# Patient Record
Sex: Female | Born: 1965 | Race: White | Hispanic: No | Marital: Married | Smoking: Never smoker
Health system: Southern US, Community
[De-identification: ages and names within clinical notes are randomized; demographics above are authoritative.]

## PROBLEM LIST (undated history)

## (undated) DIAGNOSIS — E079 Disorder of thyroid, unspecified: Secondary | ICD-10-CM

## (undated) HISTORY — DX: Disorder of thyroid, unspecified: E07.9

---

## 2007-03-11 ENCOUNTER — Encounter (INDEPENDENT_AMBULATORY_CARE_PROVIDER_SITE_OTHER): Payer: Self-pay | Admitting: Specialist

## 2007-03-11 ENCOUNTER — Inpatient Hospital Stay (HOSPITAL_COMMUNITY): Admission: AD | Admit: 2007-03-11 | Discharge: 2007-03-14 | Payer: Self-pay | Admitting: Obstetrics & Gynecology

## 2007-03-16 ENCOUNTER — Inpatient Hospital Stay (HOSPITAL_COMMUNITY): Admission: AD | Admit: 2007-03-16 | Discharge: 2007-03-16 | Payer: Self-pay | Admitting: Obstetrics & Gynecology

## 2008-07-15 ENCOUNTER — Ambulatory Visit (HOSPITAL_COMMUNITY): Admission: RE | Admit: 2008-07-15 | Discharge: 2008-07-15 | Payer: Self-pay | Admitting: Obstetrics & Gynecology

## 2008-07-15 IMAGING — MG MM DIGITAL SCREENING BILAT
4 series · 4 of 4 positions shown · non-contrast
Comparison: none

DG SCREEN MAMMOGRAM BILATERAL
Bilateral CC and MLO view(s) were taken.
Technologist: APPLETON.(APPLETON)(APPLETON)

DIGITAL SCREENING MAMMOGRAM WITH CAD:
There are scattered fibroglandular densities.  There is no dominant mass, architectural distortion 
or calcification to suggest malignancy.

[R CC]
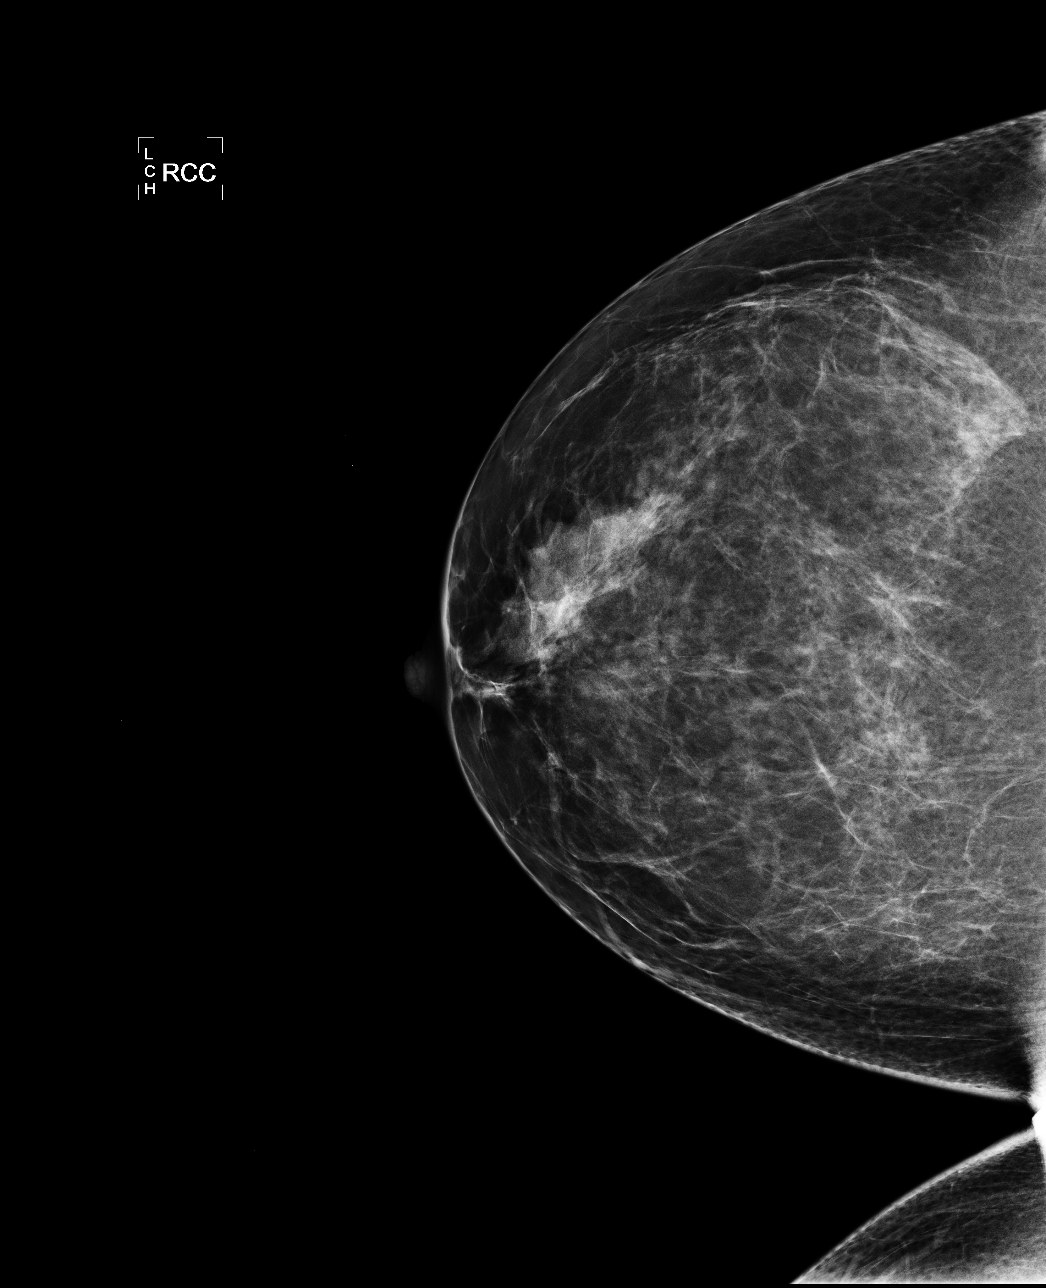

[R MLO]
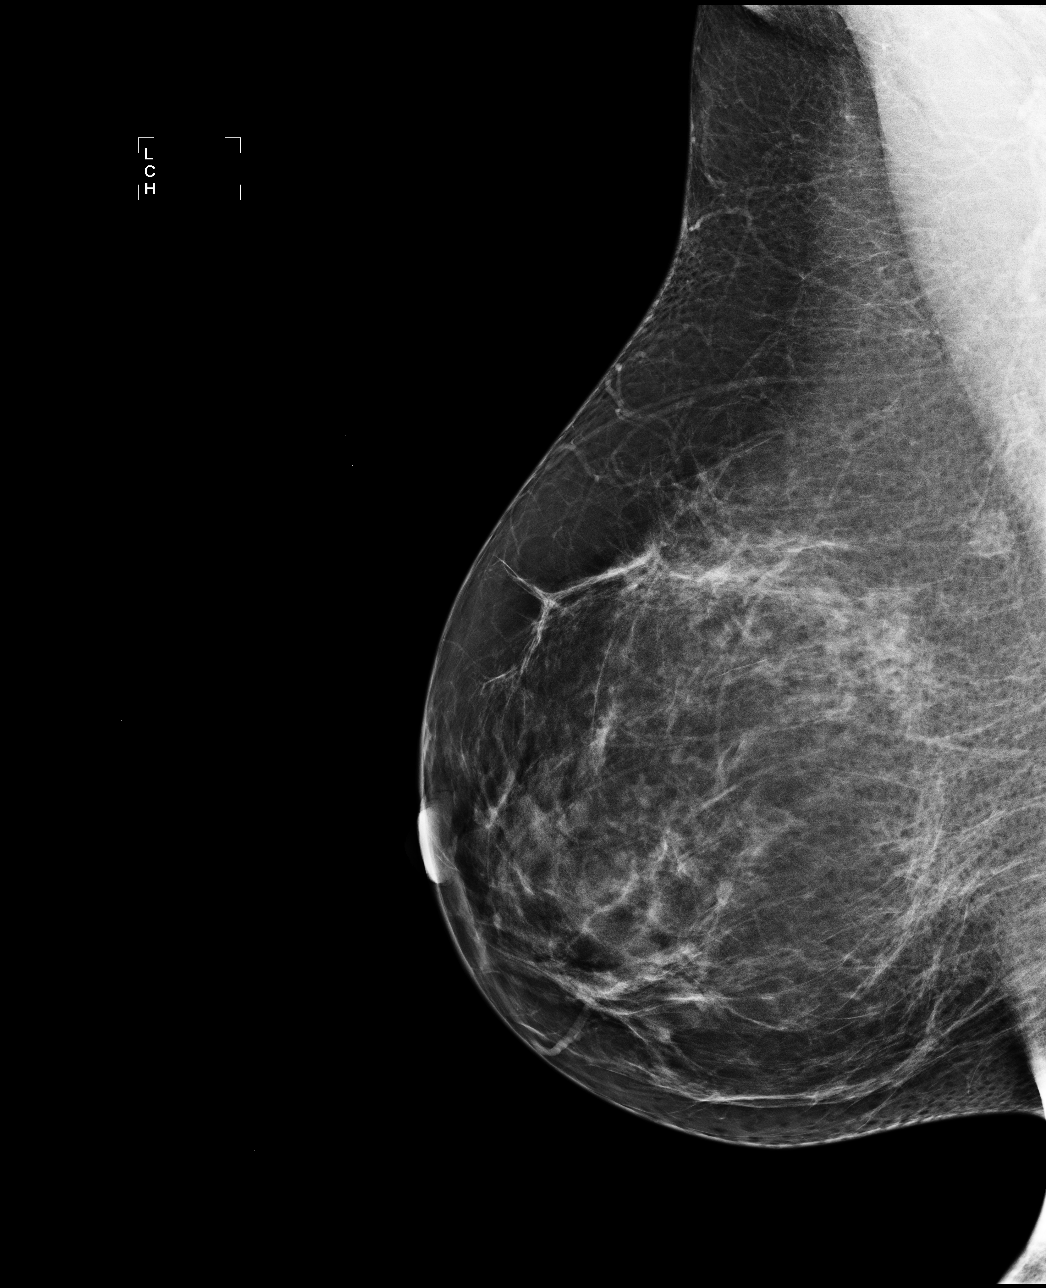

[L CC]
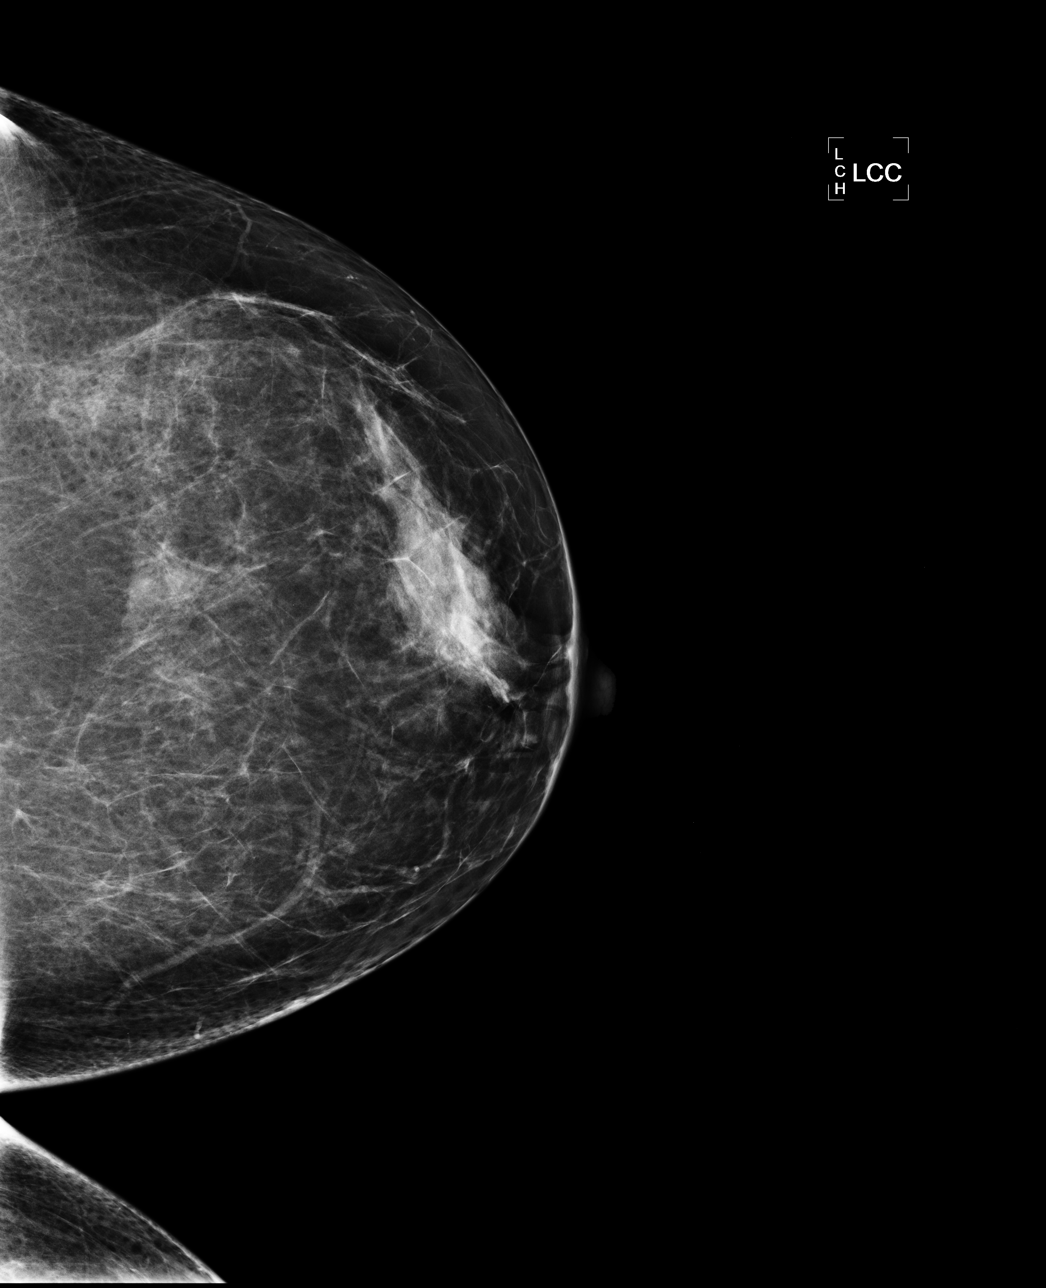

[L MLO]
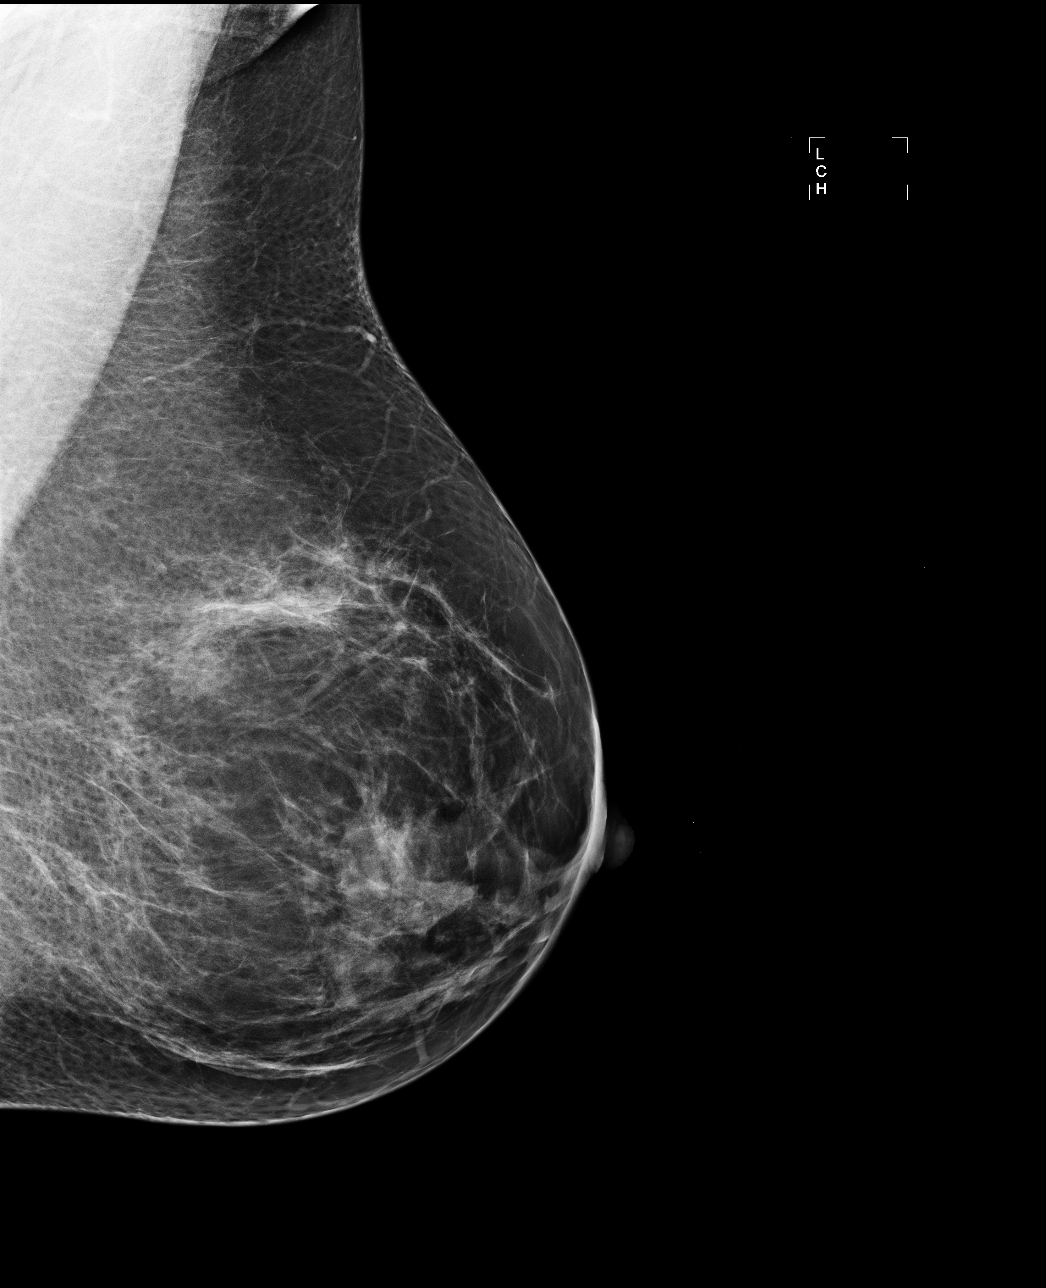

[4 of 4 positions shown; findings below may reference images not displayed]

IMPRESSION: No mammographic evidence of malignancy.  Suggest yearly screening mammography.

ASSESSMENT: Negative - BI-RADS 1

Screening mammogram in 1 year.
ANALYZED BY COMPUTER AIDED DETECTION. , THIS PROCEDURE WAS A DIGITAL MAMMOGRAM.

## 2009-07-31 ENCOUNTER — Ambulatory Visit (HOSPITAL_COMMUNITY): Admission: RE | Admit: 2009-07-31 | Discharge: 2009-07-31 | Payer: Self-pay | Admitting: Obstetrics & Gynecology

## 2009-07-31 IMAGING — MG MM DIGITAL SCREENING BILAT
3 series · 3 of 3 positions shown · non-contrast
Comparison: none

DG SCREEN MAMMOGRAM BILATERAL
Bilateral CC and MLO view(s) were taken.
Technologist: DARIO XAVIER, RT, RM

DIGITAL SCREENING MAMMOGRAM WITH CAD:
There are scattered fibroglandular densities.  No masses or malignant type calcifications are 
identified.  Compared with prior studies.
Images were processed with CAD.

[R CC]
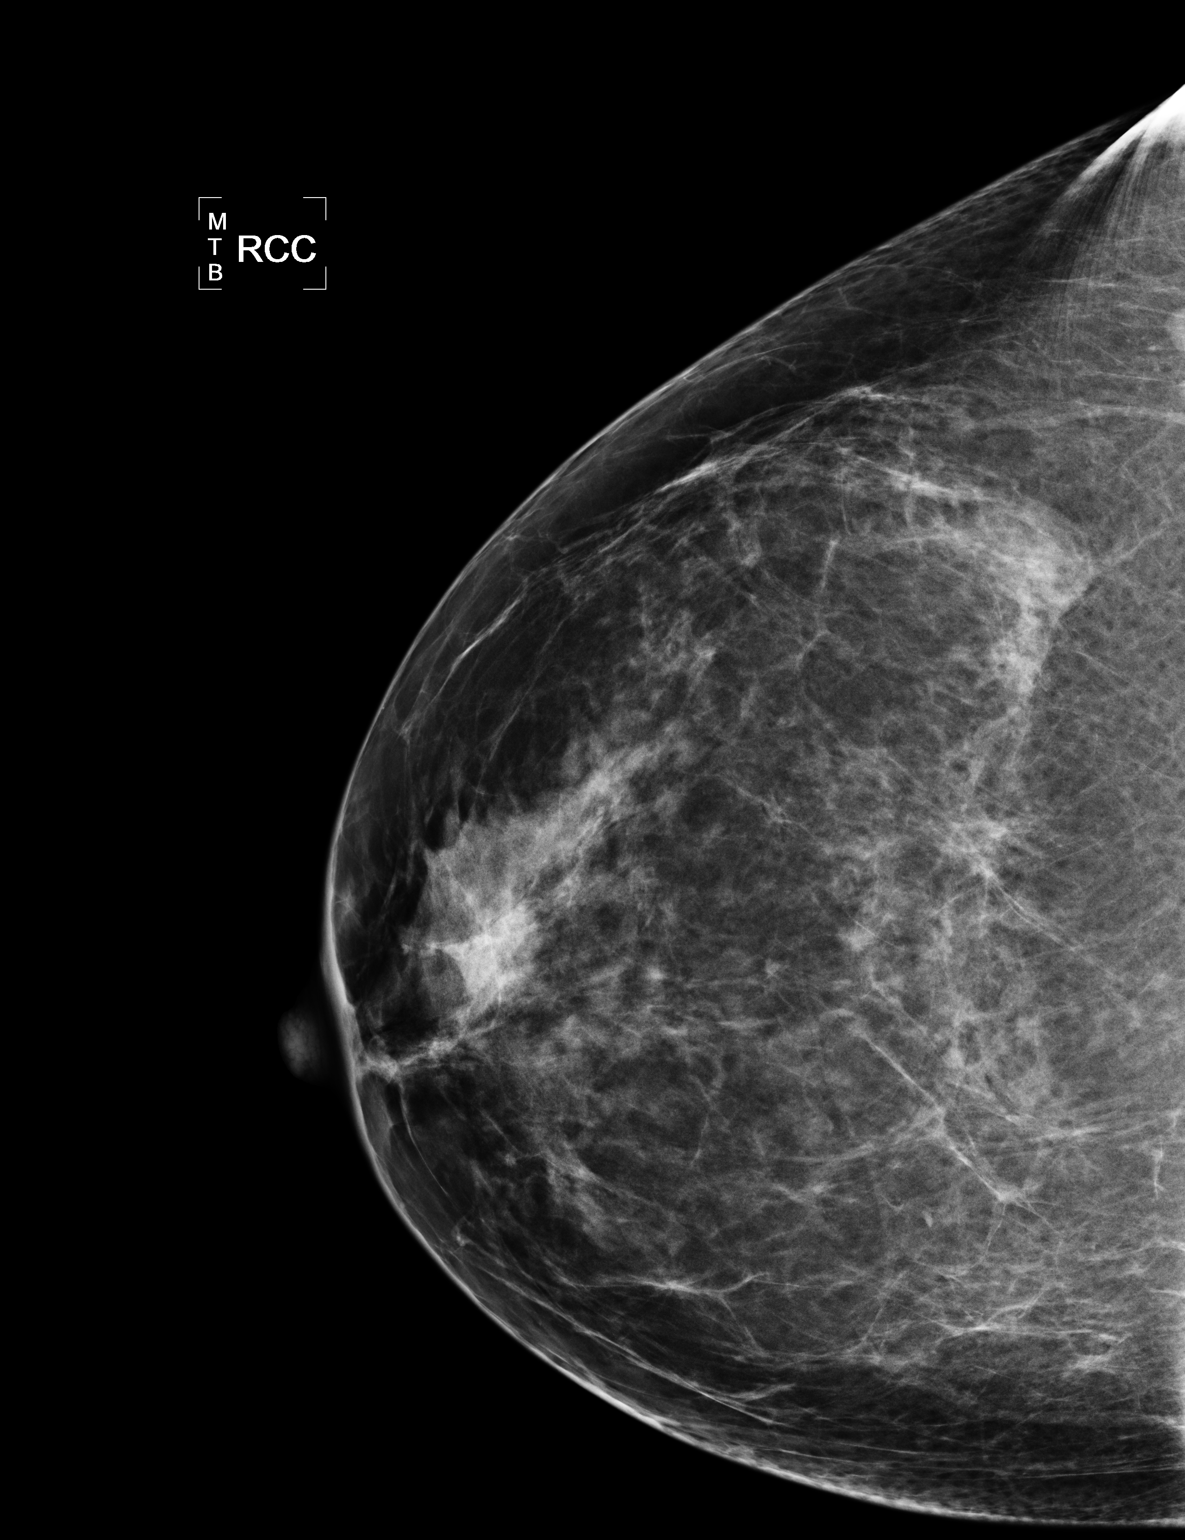

[R MLO]
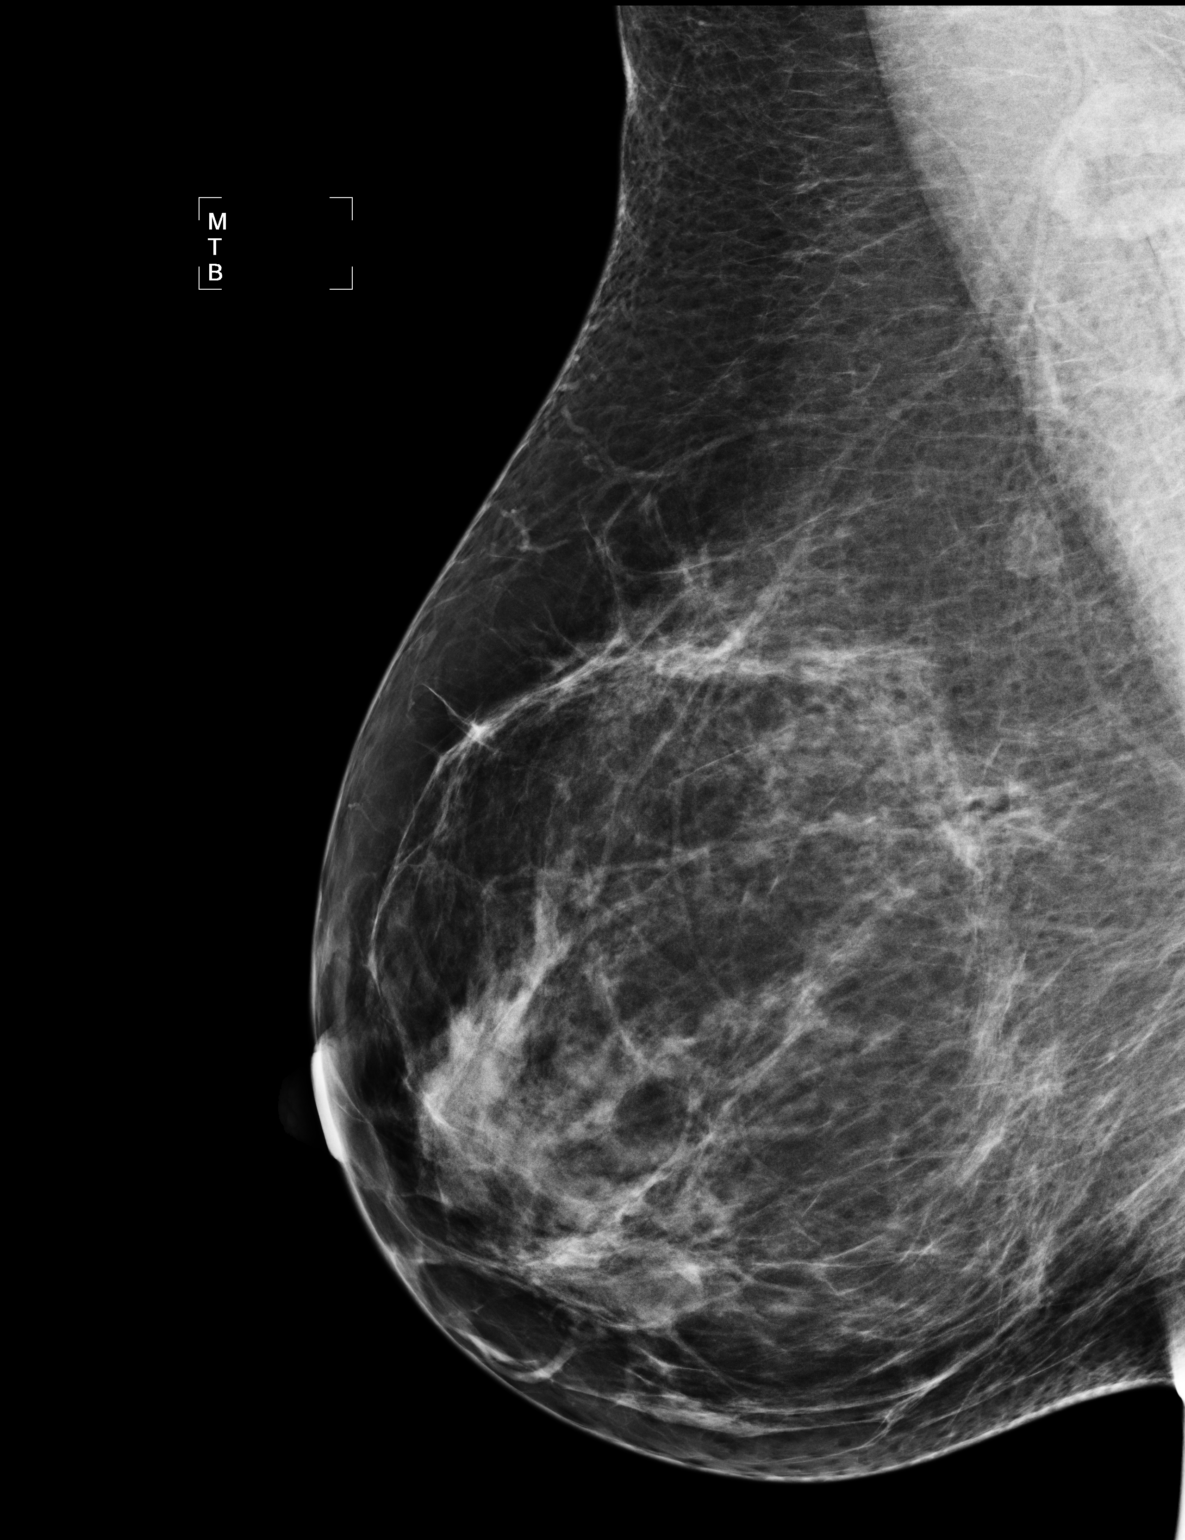

[L CC]
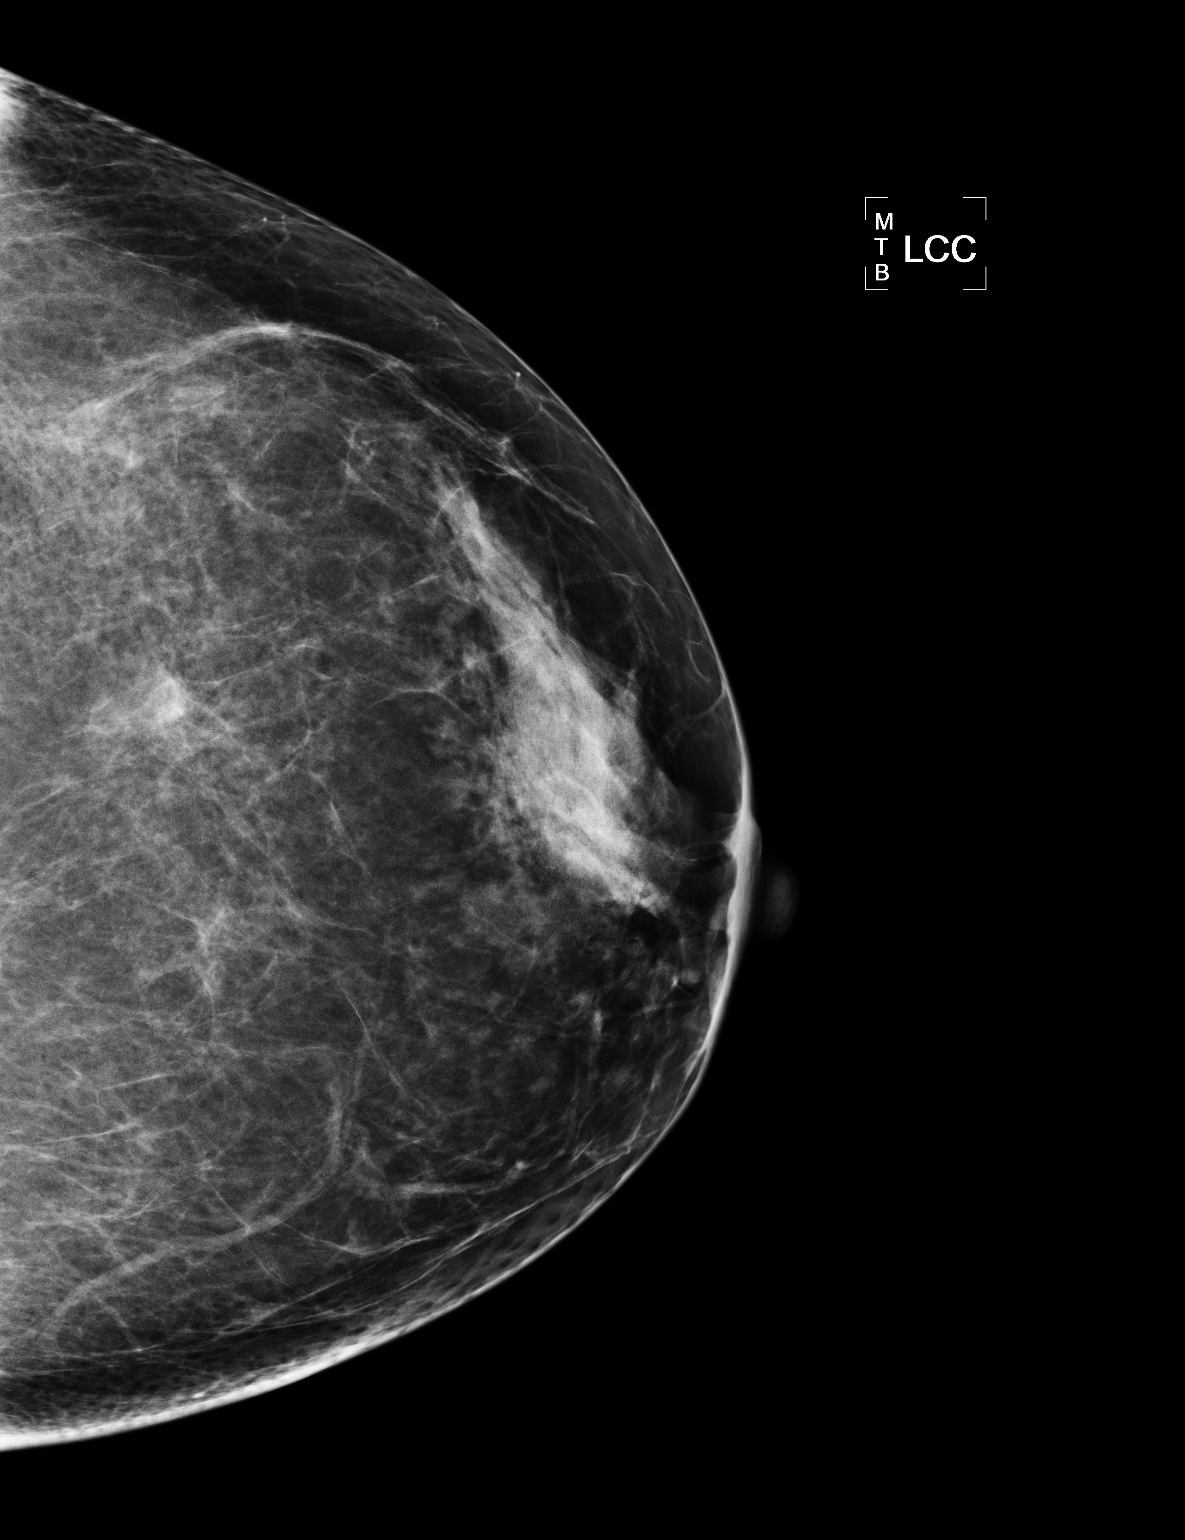

[3 of 3 positions shown; findings below may reference images not displayed]

IMPRESSION: No specific mammographic evidence of malignancy.  Next screening mammogram is recommended in one 
year.

A result letter of this screening mammogram will be mailed directly to the patient.

ASSESSMENT: Negative - BI-RADS 1

Screening mammogram in 1 year.
,

## 2010-11-07 HISTORY — PX: BREAST BIOPSY: SHX20

## 2011-03-22 NOTE — Op Note (Signed)
[name], [name]               ACCOUNT NO.:  [id number]   MEDICAL RECORD NO.:  [id number]          PATIENT TYPE:  INP   LOCATION:  9164                          FACILITY:  [facility]   PHYSICIAN:  [place] B. Earlene Plater, M.D.  DATE OF BIRTH:  [birth year]   DATE OF PROCEDURE:  03/11/2007  DATE OF DISCHARGE:                               OPERATIVE REPORT   PREOPERATIVE DIAGNOSES:  1. Forty-plus-week intrauterine pregnancy.  2. Nonreassuring fetal heart rate tracing.   POSTOPERATIVE DIAGNOSES:  1. Forty-plus-week intrauterine pregnancy.  2. Nonreassuring fetal heart rate tracing.   PROCEDURE:  Primary low transverse C-section.   SURGEON:  Chester Holstein. Earlene Plater, MD.   ANESTHESIA:  Spinal.   SPECIMENS:  Placenta to Pathology.   ESTIMATED BLOOD LOSS:  800.   COMPLICATIONS:  None.   FINDINGS:  A viable female, nuchal cord x1 with a very short umbilical  cord, 7 pounds 11 ounces, LOT position, Apgar's 8 and 9.   INDICATION:  Patient was being induced for post dates pregnancy.  When  she first arrived to Labor and Delivery after returning from the  restroom, she had what appeared to be a bradycardia fetal heart rate for  approximately 3 to 4 minutes, although it was not entirely clear that it  was fetal versus maternal.  Membranes were ruptured, Pitocin initiated.  Subsequently, patient developed an additional 8 to 9 minute deceleration  into the 70s with a prolonged recovery and a few late decelerations  thereafter.  She was still 3 cm at that point, and I discussed with the  patient that it appears that fetus would not tolerate labor given this  one prolonged deceleration and perhaps another one early on in labor,  she is remote from delivery at 3 cm and being a primigravida.  Options  of continued attempt at labor versus primary C-section discussed, the  feedback issues discussed, and they only plan to have 1 or 2 children so  this is less of a factor for them.  Risks of surgery are discussed  including infection, bleeding, damage to surrounding organs.  The  patient and husband decided to go ahead with a C-section at this point.   PROCEDURE:  Patient taken to the operating room, and a spinal anesthesia  obtained.  Patient was prepped and draped in the standard fashion, a  Foley catheter inserted into the bladder.  A Pfannenstiel incision made,  the fascia divided sharply and elevated, the rectus muscles dissected  off sharply, the posterior sheath and peritoneum elevated and entered  sharply, a bladder flap created sharply after the Alexis retractor  inserted.   The uterine incision made in a low transverse fashion with a knife.  Clear fluid at amniotomy.  It was extended laterally with bandage  scissors.   The vertex was elevated through the incision with the assistance of  fundal pressure, the vertex was easily delivered, the nose and mouth  suctioned with a bulb, a fairly tight nuchal cord noted, which was  reducible.  The remainder of the infant delivered without difficulty,  cord was clamped and cord, the infant  handed off to the awaiting  pediatricians.  Patient had just received her penicillin for Group B  Strep prophylaxis prior to going back to the OR; therefore, no  additional prophylaxis was given.  The placenta was removed by uterine  massage, and the umbilical cord was noted to be quite short and in  combination with the nuchal cord presumed to be the cause of her  decelerations.   The uterine incision was free of extension, closed in a running-locked  fashion with #0 chromic and a second imbricating layer placed with the  same suture, hemostasis obtained, tubes and ovaries appeared normal.   The pelvis was irrigated, the uterine incision and bladder flap and  subfascial space were all hemostatic.  The fascia was closed with a  running stitch of #0 Vicryl, the subcutaneous tissue was irrigated and  was hemostatic, reapproximated with a running stitch of #0  Vicryl, the  skin was closed with staples.   The patient tolerated the procedure with no complications.  She was  taken to the recovery room in a stable condition.  All counts were  correct per the operating room staff.      Gerri Spore B. Earlene Plater, M.D.  Electronically Signed     WBD/MEDQ  D:  03/11/2007  T:  03/11/2007  Job:  [id number]

## 2011-03-25 NOTE — Discharge Summary (Signed)
NAMEMARKELA, [name]               ACCOUNT NO.:  [id number]   MEDICAL RECORD NO.:  [id number]          PATIENT TYPE:  INP   LOCATION:  9123                          FACILITY:  [facility]   PHYSICIAN:  Gerri Spore B. Earlene Plater, M.D.  DATE OF BIRTH:  07/20/1966   DATE OF ADMISSION:  03/11/2007  DATE OF DISCHARGE:  03/14/2007                               DISCHARGE SUMMARY   ADMISSION DIAGNOSES:  1. 40-week intrauterine pregnancy.  2. Fetal bradycardia.   DISCHARGE DIAGNOSES:  1. 40-week intrauterine pregnancy.  2. Fetal bradycardia.   PROCEDURE:  1. Admission for induction of labor.  2. Primary low-transverse C-section for nonreassuring fetal heart rate      tracing.   HISTORY OF PRESENT ILLNESS:  A 45 year old white female, gravida 1, 40+  weeks.  Patient of Dr. Seymour Bars admitted for induction of labor.  Prenatal  care was uncomplicated other than group B strep positive.  She was  admitted, membranes ruptured, and Pitocin started.  Was noted to have a  few intermittent decelerations ultimately followed by a prolonged  bradycardia for about 8 minutes.  This ultimately recovered with  positional changes, fluid bolus, and oxygen.  In review of the strip,  there may have been one previous deceleration of a similar manner when  the patient first arrived on the labor and delivery unit.  She was found  to be 3 cm after this prolonged deceleration.  No cord was palpable.  Discussion was obtained with the patient and her husband regarding  options from that point of continued attempts at induction of labor  versus primary C-section.  Concern was that she was remote from delivery  and had already had at least one and possibly 2 prolonged bradycardias.  Therefore, I recommended proceeding with C-section.   The patient was subsequently delivered by primary low-transverse C-  section, a viable female, 7 pounds 11 ounces, Apgars were 8 and 9.  There  was a nuchal cord times one and the umbilical cord was noted  to be quite  short.   Postoperatively, the patient rapidly regained her ability to ambulate,  void, and tolerate a regular diet.  She was discharged home on the third  postoperative day in satisfactory condition.   DISCHARGE INSTRUCTIONS:  Per booklet.   FOLLOW-UP:  [facility] in 6 weeks.   DISCHARGE MEDICATIONS:  Tylox 1-2 tablets every 4-6 hours as needed for  pain.   DISPOSITION AT DISCHARGE:  Satisfactory.      Gerri Spore B. Earlene Plater, M.D.  Electronically Signed     WBD/MEDQ  D:  04/23/2007  T:  04/23/2007  Job:  [id number]

## 2015-03-12 LAB — HM PAP SMEAR: HM PAP: NORMAL

## 2015-05-04 ENCOUNTER — Telehealth: Payer: Self-pay | Admitting: Family Medicine

## 2015-05-04 NOTE — Telephone Encounter (Signed)
-----   Message from Jackson Latino, [place] sent at 05/04/2015  3:41 PM EDT ----- Regarding: FW: New Pt - Referred Contact: [phone] Ok to establish, can you please make this a phone note.   JT  ----- Message -----    From: Sheliah Hatch, MD    Sent: 05/04/2015   3:22 PM      To: Jackson Latino, CMA Subject: RE: New Pt - Referred                          Ok to establish  ----- Message -----    From: Jackson Latino, CMA    Sent: 05/04/2015   3:17 PM      To: Sheliah Hatch, MD Subject: FW: New Pt - Referred                            ----- Message -----    From: Maia Petties    Sent: 05/04/2015   3:09 PM      To: Jackson Latino, CMA Subject: New Pt - Referred                              [name] moving back to this area. She was referred to Dr. Beverely Low by [name], 59 yrs, 04/24/1956 MRN:  [id number]  She has [facility] insurance. Would Dr. Beverely Low accept as a new pt?

## 2015-05-04 NOTE — Telephone Encounter (Signed)
Left msg for pt to call back and schedule new pt appt.

## 2015-05-05 ENCOUNTER — Encounter: Payer: Self-pay | Admitting: Family Medicine

## 2015-05-05 ENCOUNTER — Ambulatory Visit (INDEPENDENT_AMBULATORY_CARE_PROVIDER_SITE_OTHER): Payer: 59 | Admitting: Family Medicine

## 2015-05-05 VITALS — BP 122/80 | HR 103 | Temp 98.9°F | Resp 16 | Wt 185.0 lb

## 2015-05-05 DIAGNOSIS — J209 Acute bronchitis, unspecified: Secondary | ICD-10-CM | POA: Insufficient documentation

## 2015-05-05 MED ORDER — PROMETHAZINE-DM 6.25-15 MG/5ML PO SYRP: 5.0000 mL | ORAL_SOLUTION | Freq: Four times a day (QID) | ORAL | Status: DC | PRN

## 2015-05-05 MED ORDER — AZITHROMYCIN 250 MG PO TABS: ORAL_TABLET | ORAL | Status: DC

## 2015-05-05 NOTE — Patient Instructions (Signed)
Follow up as needed Start the Zpack for the bronchitis and/or atypical pneumonia Use the cough syrup for nights/weekend Mucinex DM for daytime cough Drink plenty of fluids REST! Call with any questions or concerns Welcome!  We're glad to have you!

## 2015-05-05 NOTE — Progress Notes (Signed)
Pre visit review using our clinic review tool, if applicable. No additional management support is needed unless otherwise documented below in the visit note. 

## 2015-05-05 NOTE — Progress Notes (Signed)
   Subjective:    Patient ID: [name], female    DOB: Jul 28, 1966, 49 y.o.   MRN: [id number]  HPI New to establish.  Previous MD- [facility], [place]  Cough- sxs started ~3 weeks ago.  No fevers.  + fatigue.  Cough is intermittently productive but more often than not, pt feels that she needs to cough something up but it is stuck.  No sinus pain/pressure.  No HA.  No ear pain.  No known sick contacts.  No relief w/ OTC products.   Review of Systems For ROS see HPI     Objective:   Physical Exam  Constitutional: She appears well-developed and well-nourished. No distress.  HENT:  Head: Normocephalic and atraumatic.  TMs normal bilaterally Mild nasal congestion Throat w/out erythema, edema, or exudate  Eyes: Conjunctivae and EOM are normal. Pupils are equal, round, and reactive to light.  Neck: Normal range of motion. Neck supple.  Cardiovascular: Normal rate, regular rhythm, normal heart sounds and intact distal pulses.   No murmur heard. Pulmonary/Chest: Effort normal and breath sounds normal. No respiratory distress. She has no wheezes.  + hacking cough  Lymphadenopathy:    She has no cervical adenopathy.  Vitals reviewed.         Assessment & Plan:

## 2015-05-05 NOTE — Assessment & Plan Note (Signed)
New.  Pt's sxs and duration of illness consistent w/ infectious process.  Start Zpack.  Cough syrup as needed.  Reviewed supportive care and red flags that should prompt return.  Pt expressed understanding and is in agreement w/ plan.

## 2015-07-09 LAB — HM MAMMOGRAPHY

## 2015-07-10 ENCOUNTER — Other Ambulatory Visit: Payer: Self-pay | Admitting: Obstetrics and Gynecology

## 2015-07-10 DIAGNOSIS — R928 Other abnormal and inconclusive findings on diagnostic imaging of breast: Secondary | ICD-10-CM

## 2015-07-21 ENCOUNTER — Ambulatory Visit
Admission: RE | Admit: 2015-07-21 | Discharge: 2015-07-21 | Disposition: A | Discharge status: Home / Self Care | Payer: 59 | Source: Ambulatory Visit | Attending: Obstetrics and Gynecology | Admitting: Obstetrics and Gynecology

## 2015-07-21 DIAGNOSIS — R928 Other abnormal and inconclusive findings on diagnostic imaging of breast: Secondary | ICD-10-CM

## 2015-07-21 IMAGING — MG MM DIAG BREAST TOMO UNI LEFT
8 series · 8 of 24 positions shown · non-contrast
Comparison: Previous exam(s).

ADDENDUM:
Diagnostic mammograms images from [DATE] from [HOSPITAL]
in [place], [place] have become available and have been reviewed. These
images demonstrate the asymmetry in the medial left breast, anterior
depth without correlate on the obtained MLO view. Both the rolled
medial and lateral CC views from this study and the tomosynthesis
images from our recent mammogram indicate that this area should be
centrally located in the medial left breast. No ultrasound images
have become available, and presumably have not been done. Therefore,
ultrasound of the medial left breast is recommended for further
evaluation. If no lesion is found on ultrasound, then a 6 month
follow-up bilateral mammogram is recommended to ensure continued
stability.
CLINICAL DATA: 49-year-old female presenting for six-month
follow-up of an asymmetry in the medial left breast, anterior depth
on outside images. The diagnostic images from the outside study were
not available at the time of the patient's appointment.

EXAM:
DIGITAL DIAGNOSTIC LEFT MAMMOGRAM WITH 3D TOMOSYNTHESIS AND CAD

[L MLO (1 of 2)]
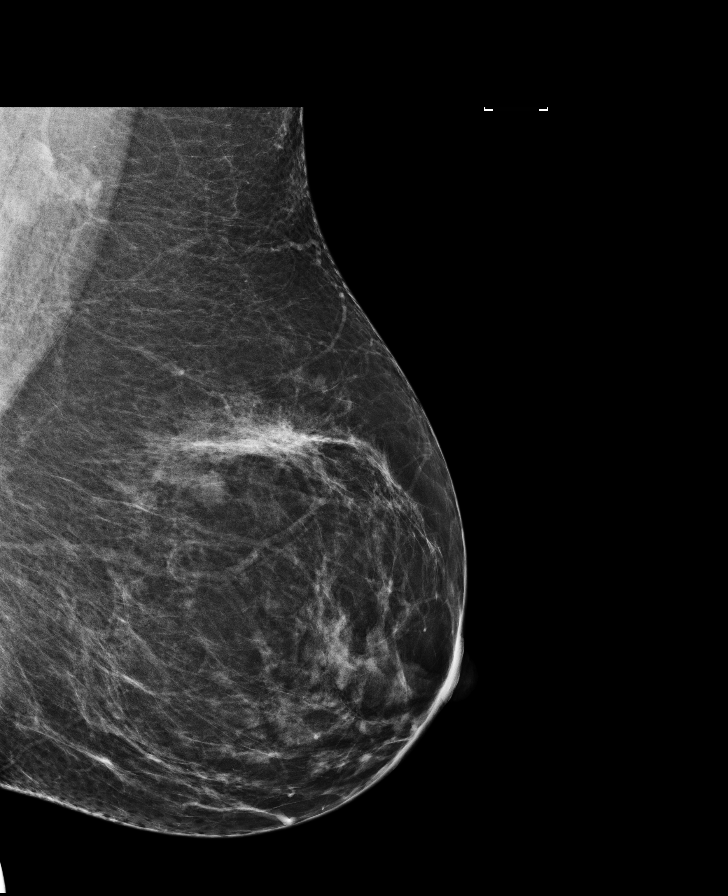

[L MLO (2 of 2)]
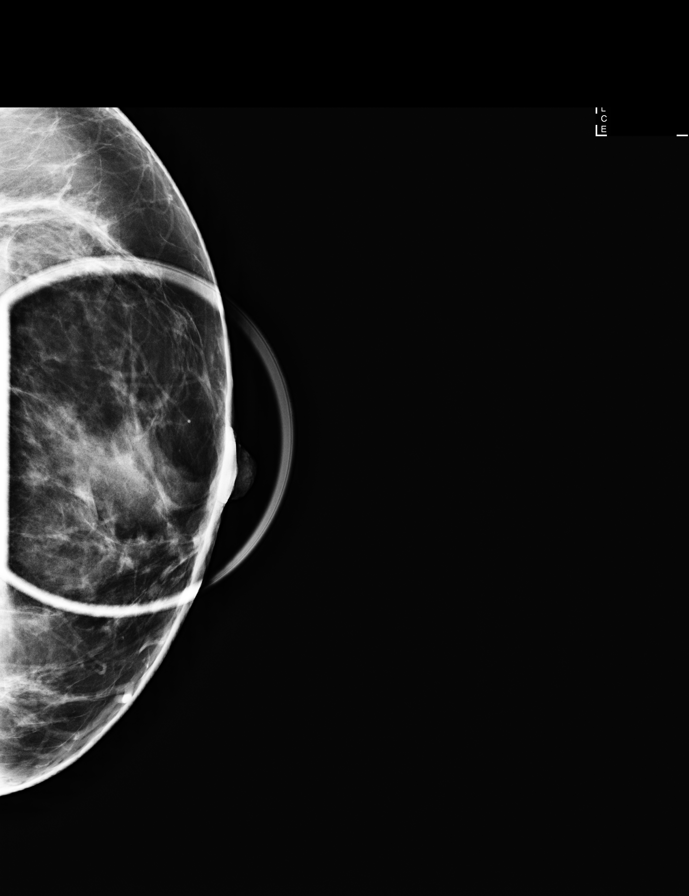

[L CC (1 of 2)]
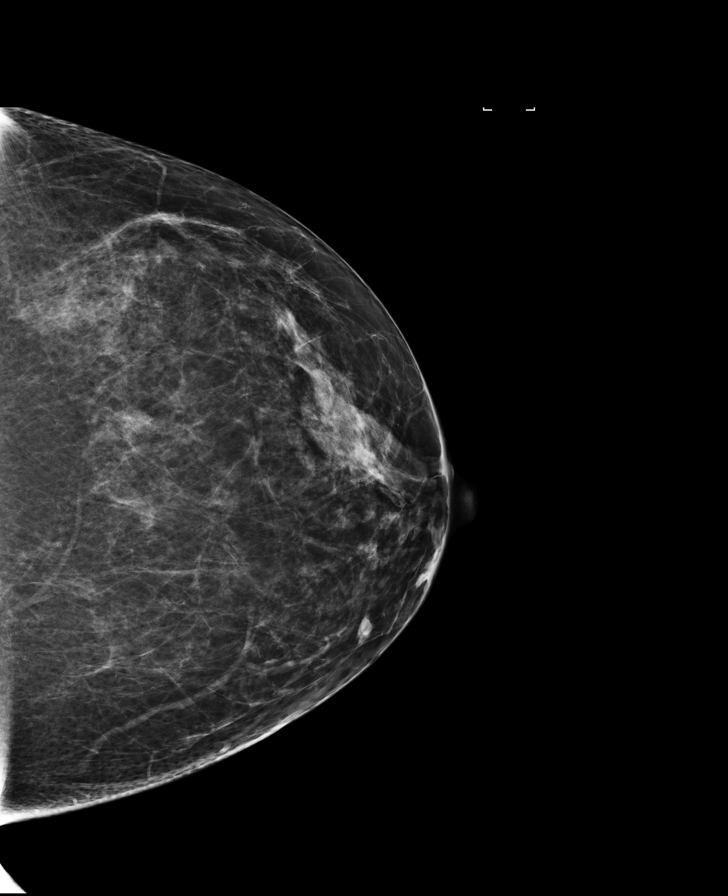

[L CC (2 of 2)]
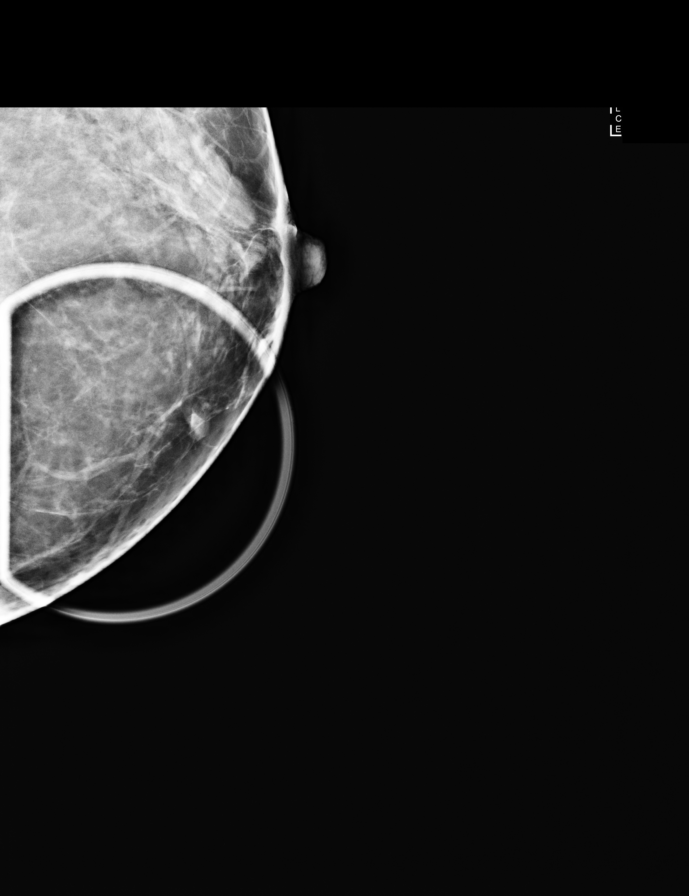

[L CC tomo (1 of 2) · tomo slice 45/89.0]
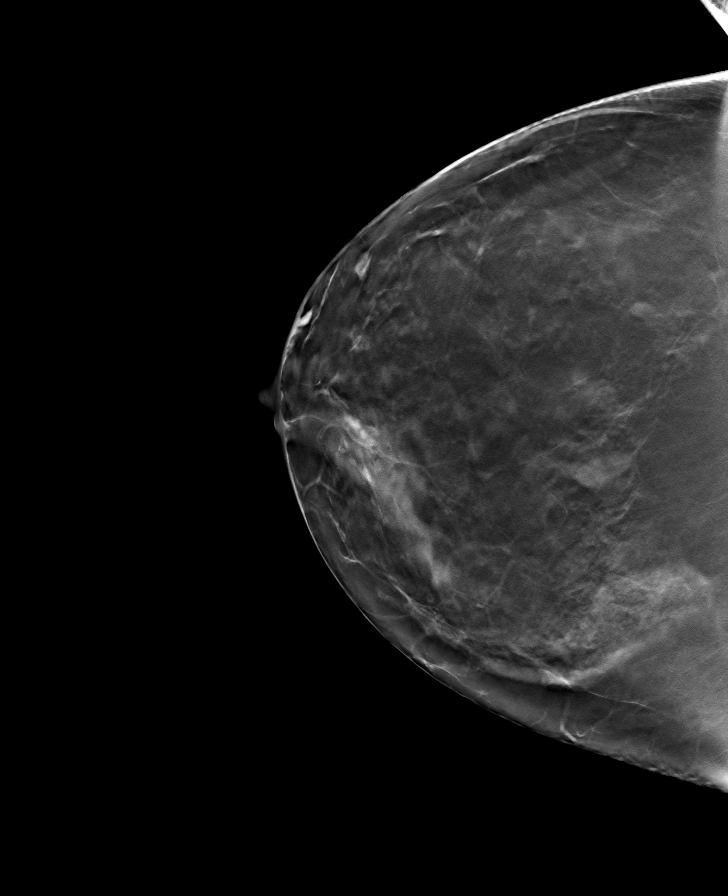

[L CC tomo (2 of 2) · tomo slice 31/62.0]
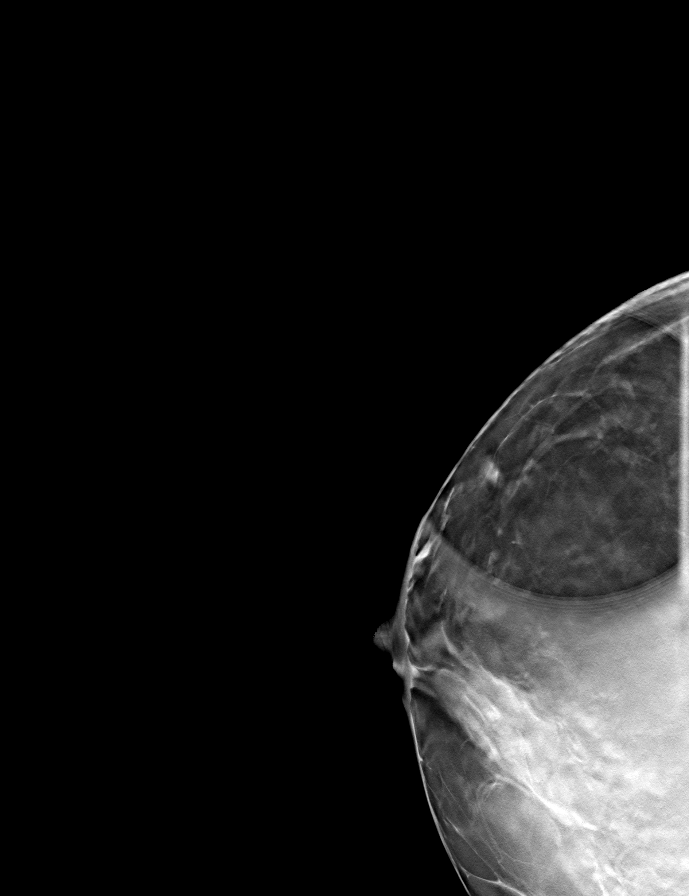

[L MLO tomo (1 of 2) · tomo slice 49/98.0]
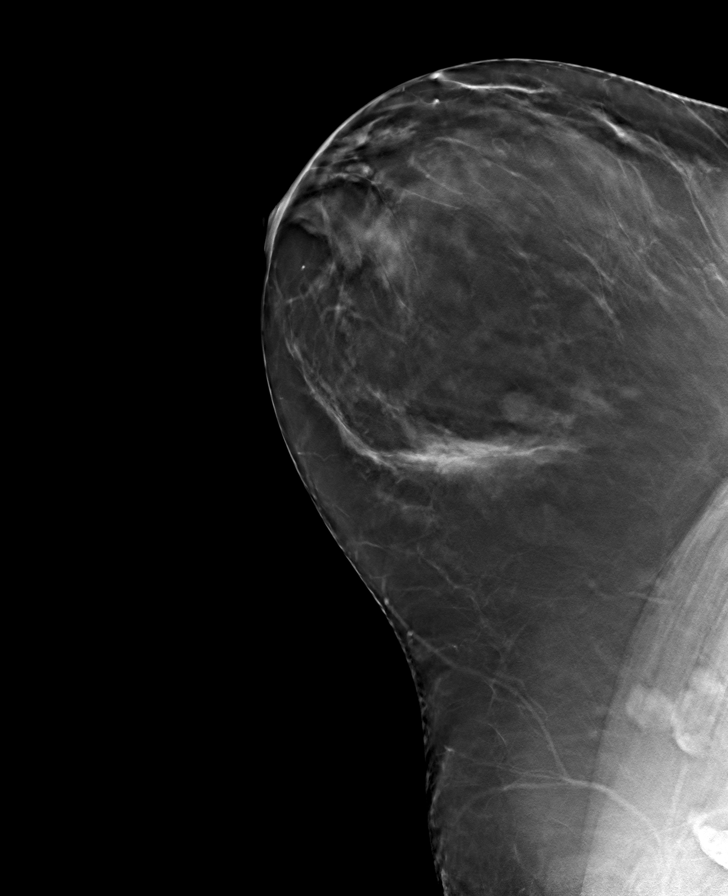

[L MLO tomo (2 of 2) · tomo slice 31/62.0]
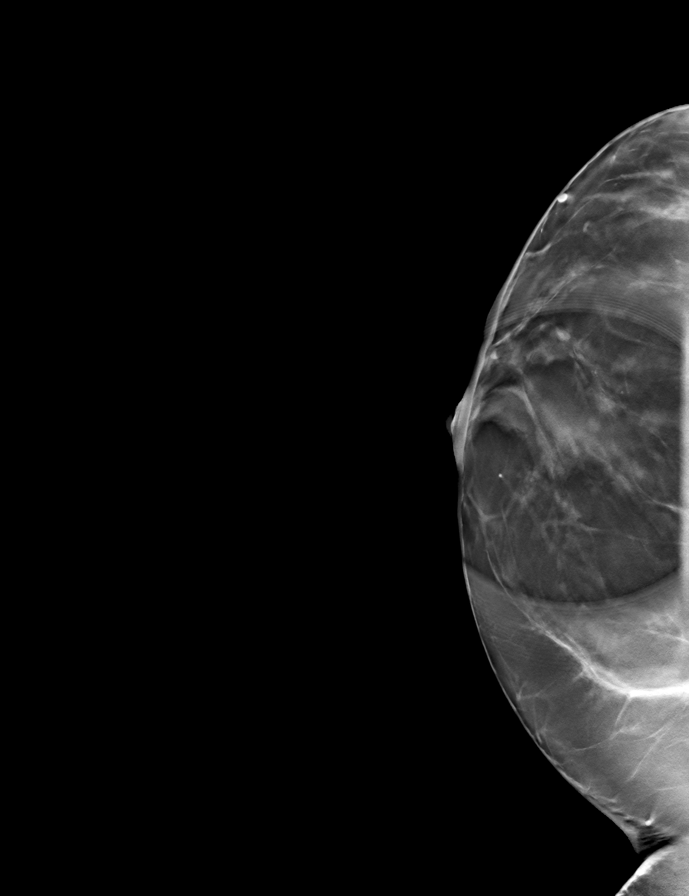

[8 of 24 positions shown; findings below may reference images not displayed]

ACR Breast Density Category b: There are scattered areas of
fibroglandular density.
FINDINGS: In the medial left breast, anterior depth on the CC view, there is a
9 mm asymmetry, without definite correlate on the MLO view. This
appears stable from the prior mammogram. No additional suspicious
masses, calcifications or areas of distortion are seen in the left
breast.

Mammographic images were processed with CAD.
IMPRESSION: 1.  Stable asymmetry in the medial left breast, anterior depth.

2. No other suspicious mammographic abnormalities are identified in
the left breast.

RECOMMENDATION:
The prior diagnostic images of the left breast have been requested.
An addendum will be made to this report when those images are
available. The need for the prior images and possible request for
ultrasound were discussed with the patient at the time of exam. If
the diagnostic images do not become available, the patient should be
recalled for a left breast ultrasound.

I have discussed the findings and recommendations with the patient.
Results were also provided in writing at the conclusion of the
visit. If applicable, a reminder letter will be sent to the patient
regarding the next appointment.

BI-RADS CATEGORY  0: Incomplete. Need additional imaging evaluation
and/or prior mammograms for comparison.

## 2015-07-28 ENCOUNTER — Other Ambulatory Visit: Payer: Self-pay | Admitting: Obstetrics and Gynecology

## 2015-07-28 DIAGNOSIS — R928 Other abnormal and inconclusive findings on diagnostic imaging of breast: Secondary | ICD-10-CM

## 2015-07-31 ENCOUNTER — Ambulatory Visit
Admission: RE | Admit: 2015-07-31 | Discharge: 2015-07-31 | Disposition: A | Discharge status: Home / Self Care | Payer: 59 | Source: Ambulatory Visit | Attending: Obstetrics and Gynecology | Admitting: Obstetrics and Gynecology

## 2015-07-31 DIAGNOSIS — R928 Other abnormal and inconclusive findings on diagnostic imaging of breast: Secondary | ICD-10-CM

## 2015-07-31 IMAGING — US US BREAST LTD UNI LEFT INC AXILLA
1 series · 2 of 2 positions shown · non-contrast
Comparison: Previous exam(s).

CLINICAL DATA: The patient returns for sonographic evaluation left
medial breast after prior initial diagnostic evaluation in [facility]
[DATE], for which prior films have now become available, with
subsequent diagnostic followup [DATE] at our facility.

EXAM:
ULTRASOUND OF THE left BREAST

[Series 1: us breast ltd uni left inc axilla · 0.07mm/px · 2 of 2 slices shown]
[im 1/2]
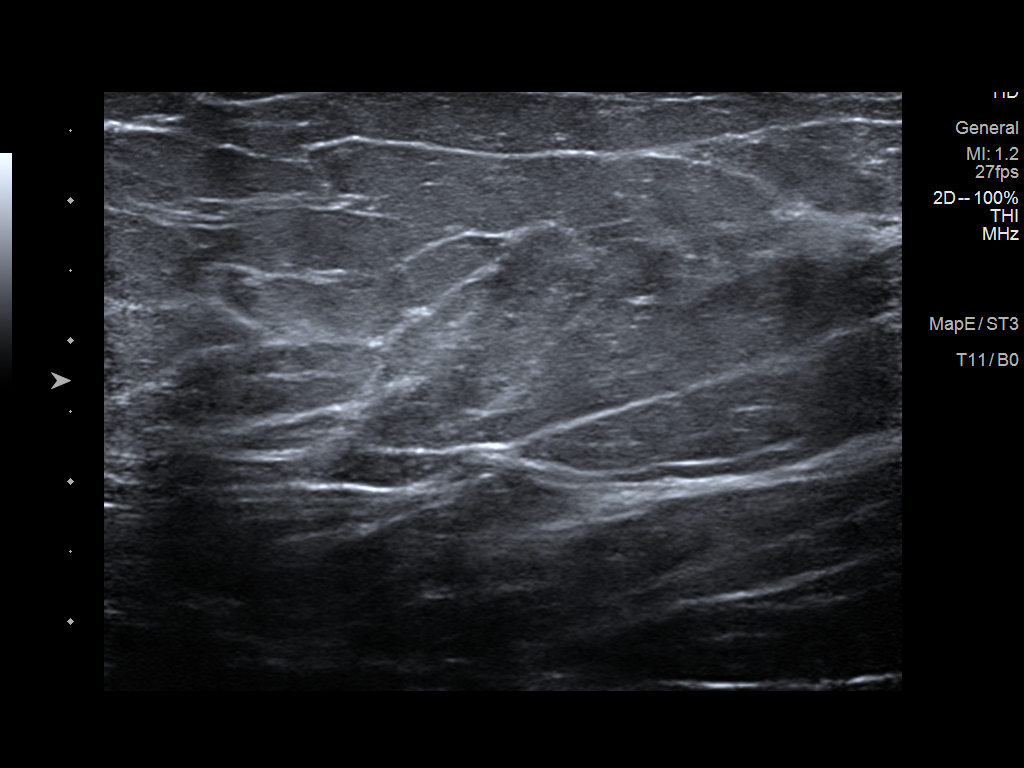
[im 2/2]
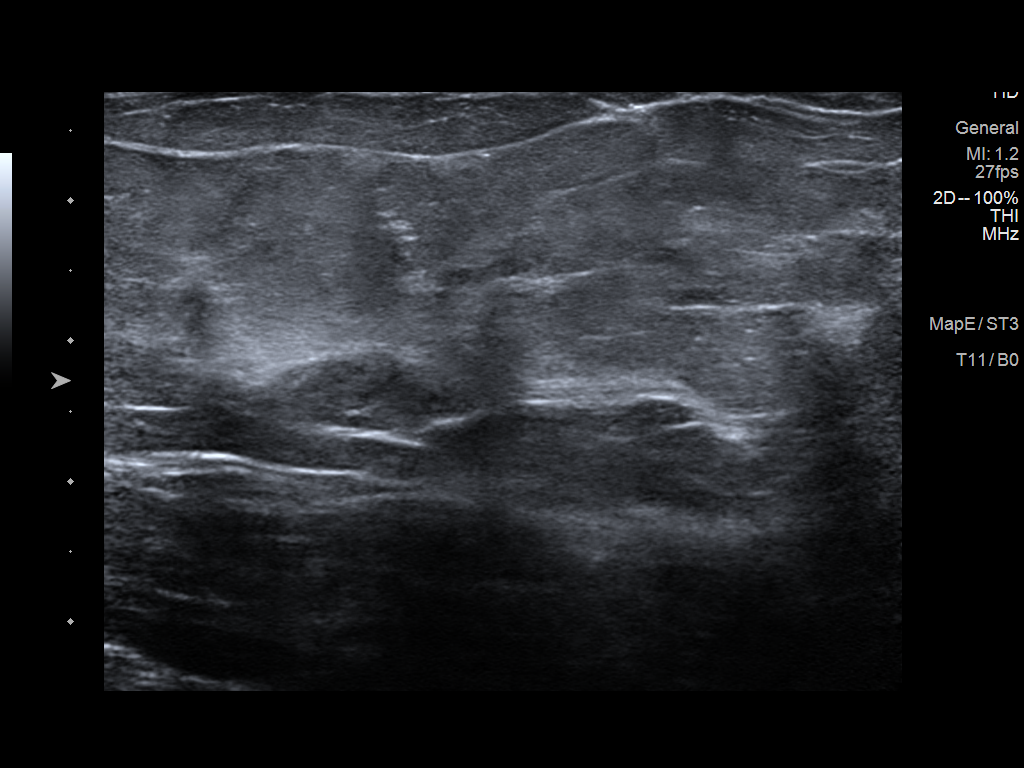

[2 of 2 positions shown; findings below may reference images not displayed]

FINDINGS: On physical exam, I palpate no abnormality in the left medial
breast.

Targeted ultrasound is performed, showing no sonographic correlate
to the asymmetry in the left medial breast.
IMPRESSION: No sonographic correlate or abnormality identified. The mammographic
finding may represent a benign intramammary lymph node or normal
tissue. Malignancy is considered less likely but not excluded. As I
discussed with the patient, although 3D guided biopsy is an option
for further evaluation and to evaluate for malignancy, it could be
difficult to perform biopsy of this finding because it is visualized
on only 1 mammographic projection, and targeting would not
necessarily be confident at the time of 3D guided biopsy. Given the
low risk of malignancy, she agrees with the plan of short-term
interval followup but may contact us sooner if she changes her mind.

RECOMMENDATION:
Bilateral diagnostic mammography and possibly left breast ultrasound
[DATE]

I have discussed the findings and recommendations with the patient.
Results were also provided in writing at the conclusion of the
visit. If applicable, a reminder letter will be sent to the patient
regarding the next appointment.

BI-RADS CATEGORY  3: Probably benign.

## 2015-08-27 ENCOUNTER — Telehealth: Payer: Self-pay | Admitting: Behavioral Health

## 2015-08-27 ENCOUNTER — Encounter: Payer: Self-pay | Admitting: Behavioral Health

## 2015-08-27 NOTE — Telephone Encounter (Signed)
Pre-Visit Call completed with patient and chart updated.   Pre-Visit Info documented in Specialty Comments under SnapShot.    

## 2015-08-28 ENCOUNTER — Encounter: Payer: Self-pay | Admitting: Family Medicine

## 2015-08-28 ENCOUNTER — Ambulatory Visit (INDEPENDENT_AMBULATORY_CARE_PROVIDER_SITE_OTHER): Payer: 59 | Admitting: Family Medicine

## 2015-08-28 VITALS — BP 122/80 | HR 76 | Temp 98.1°F | Resp 16 | Ht 65.75 in | Wt 186.2 lb

## 2015-08-28 DIAGNOSIS — Z0000 Encounter for general adult medical examination without abnormal findings: Secondary | ICD-10-CM | POA: Insufficient documentation

## 2015-08-28 DIAGNOSIS — Z23 Encounter for immunization: Secondary | ICD-10-CM | POA: Diagnosis not present

## 2015-08-28 DIAGNOSIS — Z8249 Family history of ischemic heart disease and other diseases of the circulatory system: Secondary | ICD-10-CM | POA: Diagnosis not present

## 2015-08-28 LAB — CBC WITH DIFFERENTIAL/PLATELET
BASOS PCT: 0 % (ref 0–1)
Basophils Absolute: 0 10*3/uL (ref 0.0–0.1)
EOS ABS: 0.4 10*3/uL (ref 0.0–0.7)
Eosinophils Relative: 4 % (ref 0–5)
HEMATOCRIT: 39.3 % (ref 36.0–46.0)
HEMOGLOBIN: 12.9 g/dL (ref 12.0–15.0)
Lymphocytes Relative: 28 % (ref 12–46)
Lymphs Abs: 2.5 10*3/uL (ref 0.7–4.0)
MCH: 24.8 pg — AB (ref 26.0–34.0)
MCHC: 32.8 g/dL (ref 30.0–36.0)
MCV: 75.6 fL — AB (ref 78.0–100.0)
MONO ABS: 0.6 10*3/uL (ref 0.1–1.0)
MONOS PCT: 7 % (ref 3–12)
MPV: 10.1 fL (ref 8.6–12.4)
Neutro Abs: 5.4 10*3/uL (ref 1.7–7.7)
Neutrophils Relative %: 61 % (ref 43–77)
Platelets: 423 10*3/uL — ABNORMAL HIGH (ref 150–400)
RBC: 5.2 MIL/uL — ABNORMAL HIGH (ref 3.87–5.11)
RDW: 14.9 % (ref 11.5–15.5)
WBC: 8.9 10*3/uL (ref 4.0–10.5)

## 2015-08-28 LAB — TSH: TSH: 0.489 u[IU]/mL (ref 0.350–4.500)

## 2015-08-28 LAB — LIPID PANEL
CHOLESTEROL: 193 mg/dL (ref 125–200)
HDL: 40 mg/dL — ABNORMAL LOW (ref >=46)
LDL Cholesterol: 118 mg/dL (ref <130)
Total CHOL/HDL Ratio: 4.8 Ratio (ref <=5.0)
Triglycerides: 177 mg/dL — ABNORMAL HIGH (ref <150)
VLDL: 35 mg/dL — ABNORMAL HIGH (ref <30)

## 2015-08-28 LAB — BASIC METABOLIC PANEL
BUN: 17 mg/dL (ref 7–25)
CHLORIDE: 103 mmol/L (ref 98–110)
CO2: 23 mmol/L (ref 20–31)
Calcium: 9.3 mg/dL (ref 8.6–10.2)
Creat: 0.6 mg/dL (ref 0.50–1.10)
GLUCOSE: 94 mg/dL (ref 65–99)
POTASSIUM: 4.1 mmol/L (ref 3.5–5.3)
Sodium: 138 mmol/L (ref 135–146)

## 2015-08-28 LAB — HEPATIC FUNCTION PANEL
ALBUMIN: 3.9 g/dL (ref 3.6–5.1)
ALK PHOS: 84 U/L (ref 33–115)
ALT: 11 U/L (ref 6–29)
AST: 11 U/L (ref 10–35)
Bilirubin, Direct: 0.1 mg/dL (ref <=0.2)
Indirect Bilirubin: 0.3 mg/dL (ref 0.2–1.2)
TOTAL PROTEIN: 7.2 g/dL (ref 6.1–8.1)
Total Bilirubin: 0.4 mg/dL (ref 0.2–1.2)

## 2015-08-28 NOTE — Progress Notes (Signed)
   Subjective:    Patient ID: [name], female    DOB: 04-22-1966, 49 y.o.   MRN: [id number]  HPI CPE- UTD on GYN ([facility])  Son has enlarged aorta and pt was told by geneticist that she needs to have screening done.  Husband had ECHO.     Review of Systems Patient reports no vision/ hearing changes, adenopathy,fever, weight change,  persistant/recurrent hoarseness , swallowing issues, chest pain, palpitations, edema, persistant/recurrent cough, hemoptysis, dyspnea (rest/exertional/paroxysmal nocturnal), gastrointestinal bleeding (melena, rectal bleeding), abdominal pain, significant heartburn, bowel changes, GU symptoms (dysuria, hematuria, incontinence), Gyn symptoms (abnormal  bleeding, pain),  syncope, focal weakness, memory loss, numbness & tingling, skin/hair/nail changes, abnormal bruising or bleeding, anxiety, or depression.     Objective:   Physical Exam General Appearance:    Alert, cooperative, no distress, appears stated age, overweight  Head:    Normocephalic, without obvious abnormality, atraumatic  Eyes:    PERRL, conjunctiva/corneas clear, EOM's intact, fundi    benign, both eyes  Ears:    Normal TM's and external ear canals, both ears  Nose:   Nares normal, septum midline, mucosa normal, no drainage    or sinus tenderness  Throat:   Lips, mucosa, and tongue normal; teeth and gums normal  Neck:   Supple, symmetrical, trachea midline, no adenopathy;    Thyroid: no enlargement/tenderness/nodules  Back:     Symmetric, no curvature, ROM normal, no CVA tenderness  Lungs:     Clear to auscultation bilaterally, respirations unlabored  Chest Wall:    No tenderness or deformity   Heart:    Regular rate and rhythm, S1 and S2 normal, no murmur, rub   or gallop  Breast Exam:    Deferred to GYN  Abdomen:     Soft, non-tender, bowel sounds active all four quadrants,    no masses, no organomegaly  Genitalia:    Deferred to GYN  Rectal:    Extremities:   Extremities normal,  atraumatic, no cyanosis or edema  Pulses:   2+ and symmetric all extremities  Skin:   Skin color, texture, turgor normal, no rashes or lesions  Lymph nodes:   Cervical, supraclavicular, and axillary nodes normal  Neurologic:   CNII-XII intact, normal strength, sensation and reflexes    throughout          Assessment & Plan:

## 2015-08-28 NOTE — Assessment & Plan Note (Signed)
Pt's PE WNL w/ exception of being overweight.  Stressed need for healthy diet and regular exercise.  UTD on GYN.  Check labs.  Flu shot given.  Anticipatory guidance provided.

## 2015-08-28 NOTE — Progress Notes (Signed)
Pre visit review using our clinic review tool, if applicable. No additional management support is needed unless otherwise documented below in the visit note. 

## 2015-08-28 NOTE — Patient Instructions (Signed)
Follow up in 1 year or as needed We'll notify you of your lab results and make any changes if needed Continue to work on healthy diet and regular exercise- you can do it!! Call with any questions or concerns If you want to join us at the new [place] office, any scheduled appointments will automatically transfer and we will see you at [address], [place], [place] [id number]  Happy Fall!!!

## 2015-08-29 LAB — VITAMIN D 25 HYDROXY (VIT D DEFICIENCY, FRACTURES): Vit D, 25-Hydroxy: 20 ng/mL — ABNORMAL LOW (ref 30–100)

## 2015-09-01 ENCOUNTER — Other Ambulatory Visit: Payer: Self-pay | Admitting: General Practice

## 2015-09-01 MED ORDER — VITAMIN D (ERGOCALCIFEROL) 1.25 MG (50000 UNIT) PO CAPS: 50000.0000 [IU] | ORAL_CAPSULE | ORAL | Status: DC

## 2015-09-02 ENCOUNTER — Encounter: Payer: Self-pay | Admitting: Family Medicine

## 2015-09-02 MED ORDER — PHENTERMINE HCL 37.5 MG PO CAPS: 37.5000 mg | ORAL_CAPSULE | ORAL | Status: AC

## 2015-09-02 NOTE — Telephone Encounter (Signed)
Medication filled to pharmacy as requested.   

## 2015-09-10 ENCOUNTER — Other Ambulatory Visit: Payer: Self-pay

## 2015-09-10 ENCOUNTER — Ambulatory Visit (HOSPITAL_COMMUNITY): Payer: 59 | Attending: Internal Medicine

## 2015-09-10 DIAGNOSIS — I517 Cardiomegaly: Secondary | ICD-10-CM | POA: Insufficient documentation

## 2015-09-10 DIAGNOSIS — Z8249 Family history of ischemic heart disease and other diseases of the circulatory system: Secondary | ICD-10-CM | POA: Insufficient documentation

## 2015-09-11 ENCOUNTER — Encounter: Payer: Self-pay | Admitting: Family Medicine

## 2015-11-24 ENCOUNTER — Telehealth: Payer: Self-pay | Admitting: Family Medicine

## 2015-11-24 NOTE — Telephone Encounter (Signed)
Vitamin D 50,000 IU was prescribed on 08/28/2015 for 12 weeks following lab results.  PCP may not want the patient to continue as may have completed amount needed.  Called the patient to return call to our office regarding request. Will also forward this request to PCP for review.

## 2015-11-24 NOTE — Telephone Encounter (Signed)
Caller name: Olegario Messier  Relationship to patient: [facility]   Can be reached: [phone]  Pharmacy: [id number] - [place], [place] - [address] AT [facility] [address]  Reason for call: She is requesting a refill on pt's Vitamin D.

## 2015-11-25 NOTE — Telephone Encounter (Addendum)
Left message on patients answering machine. Advised she needs to come in for labs (Vitamin 25) before Vitamin D can be refilled.

## 2015-11-25 NOTE — Telephone Encounter (Signed)
No refill on Vit D w/o repeat Vit D 25, hydroxy testing to determine if this is needed.  Should continue daily supplement of at least 2,000 units

## 2015-11-27 ENCOUNTER — Telehealth: Payer: Self-pay | Admitting: Family Medicine

## 2015-11-27 NOTE — Telephone Encounter (Signed)
error:[id number] ° °

## 2015-11-30 ENCOUNTER — Other Ambulatory Visit: Payer: Self-pay

## 2015-11-30 MED ORDER — VITAMIN D (ERGOCALCIFEROL) 1.25 MG (50000 UNIT) PO CAPS: 50000.0000 [IU] | ORAL_CAPSULE | ORAL | Status: AC

## 2015-12-04 ENCOUNTER — Other Ambulatory Visit: Payer: Self-pay | Admitting: Obstetrics and Gynecology

## 2015-12-04 DIAGNOSIS — R928 Other abnormal and inconclusive findings on diagnostic imaging of breast: Secondary | ICD-10-CM

## 2016-01-04 ENCOUNTER — Ambulatory Visit
Admission: RE | Admit: 2016-01-04 | Discharge: 2016-01-04 | Disposition: A | Discharge status: Home / Self Care | Payer: 59 | Source: Ambulatory Visit | Attending: Obstetrics and Gynecology | Admitting: Obstetrics and Gynecology

## 2016-01-04 DIAGNOSIS — R928 Other abnormal and inconclusive findings on diagnostic imaging of breast: Secondary | ICD-10-CM

## 2016-01-04 IMAGING — MG MM DIAG BREAST TOMO BILATERAL
8 series · 8 of 24 positions shown · non-contrast
Comparison: [DATE], [DATE], [DATE], [DATE]

CLINICAL DATA: Re-evaluate left breast asymmetry. The patient is
asymptomatic.

EXAM:
DIGITAL DIAGNOSTIC BILATERAL MAMMOGRAM WITH 3D TOMOSYNTHESIS WITH
CAD
ULTRASOUND LEFT BREAST

[R MLO]
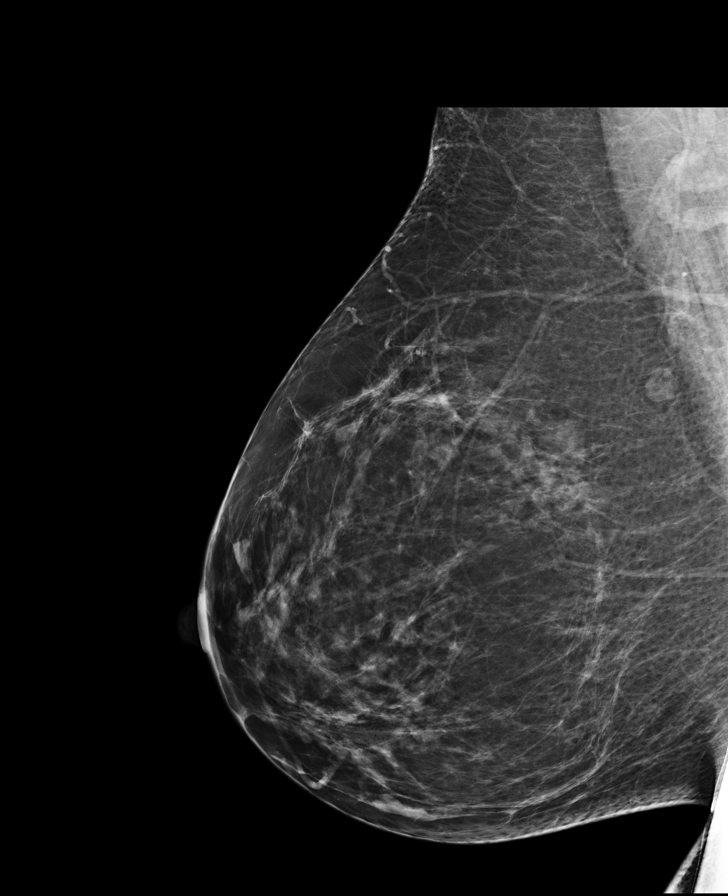

[R CC]
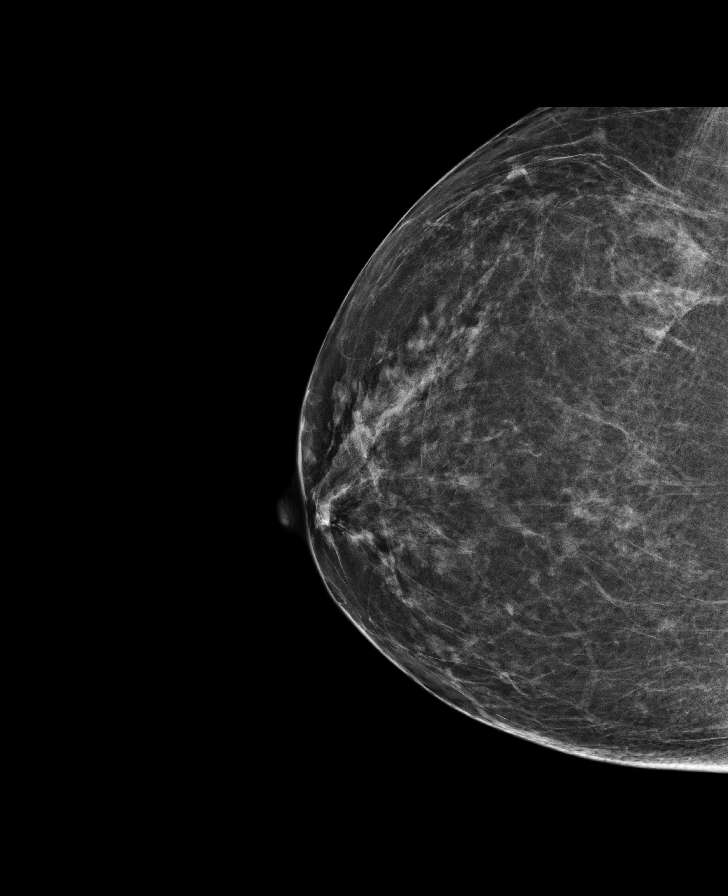

[L MLO]
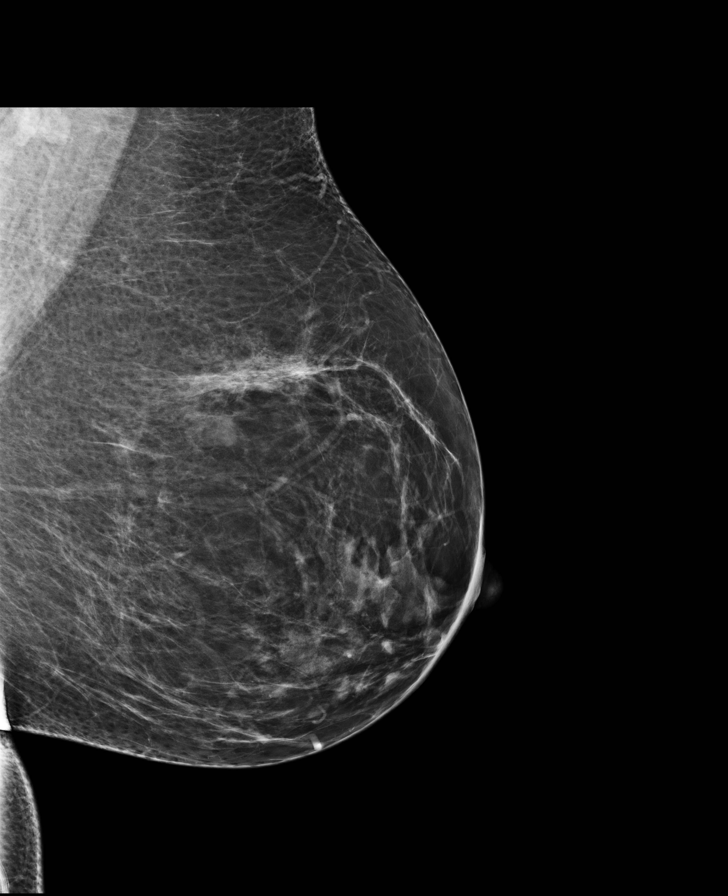

[L CC]
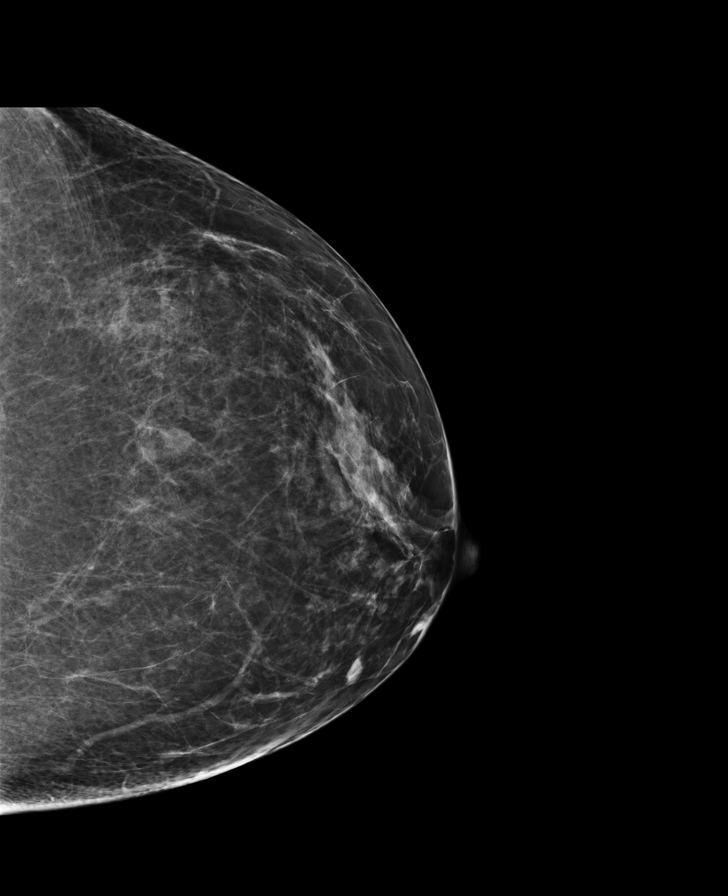

[R MLO tomo · tomo slice 48/95.0]
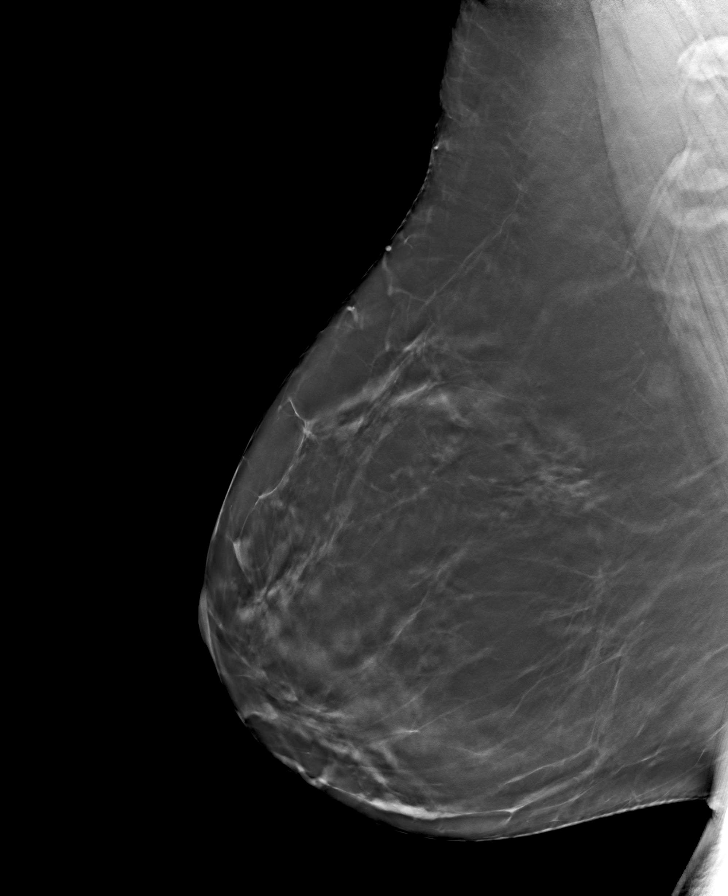

[L MLO tomo · tomo slice 46/91.0]
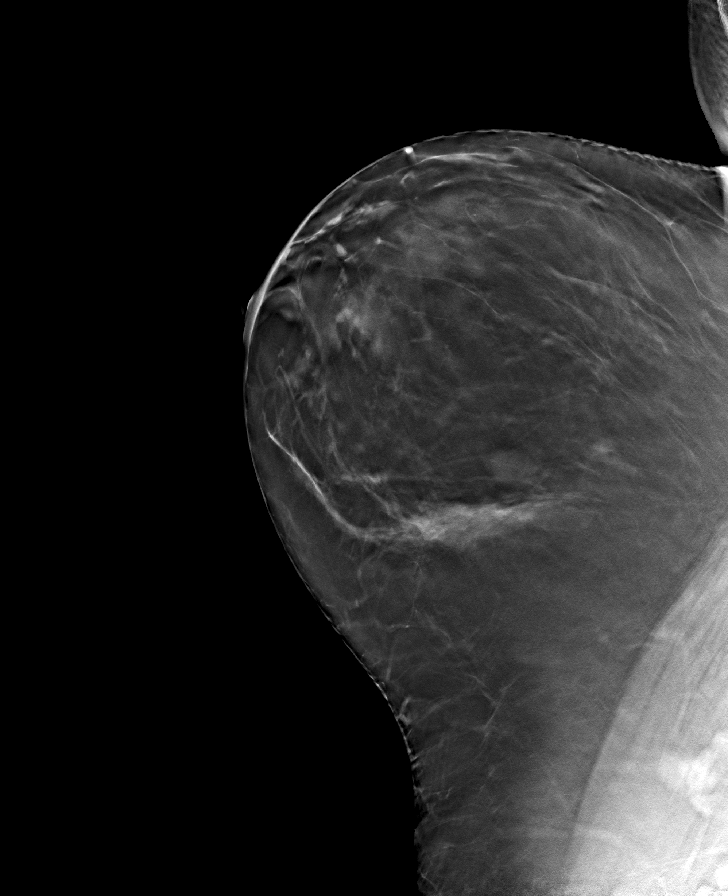

[L CC tomo · tomo slice 48/95.0]
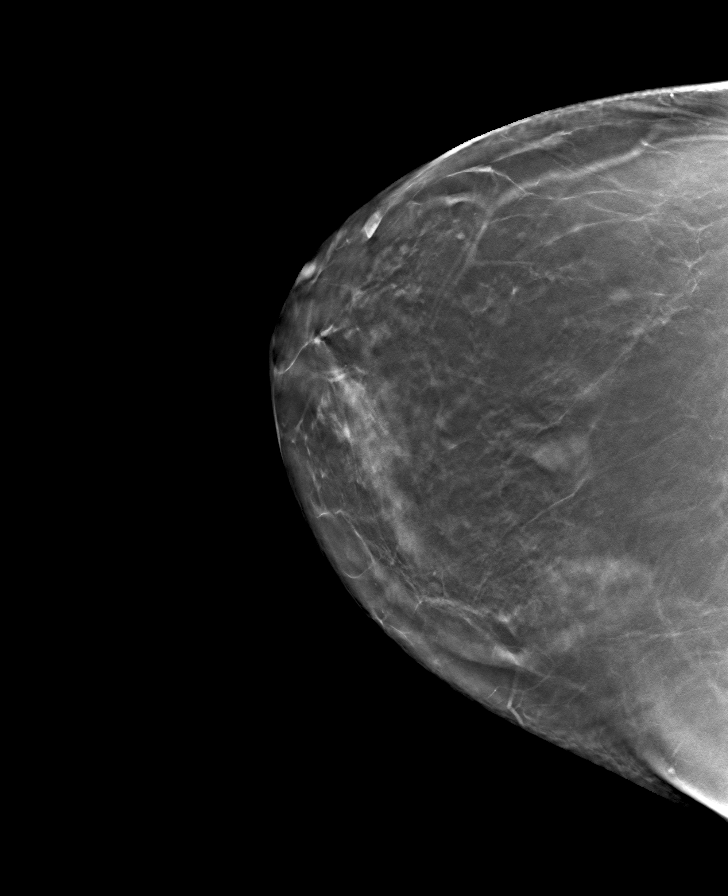

[R CC tomo · tomo slice 45/88.0]
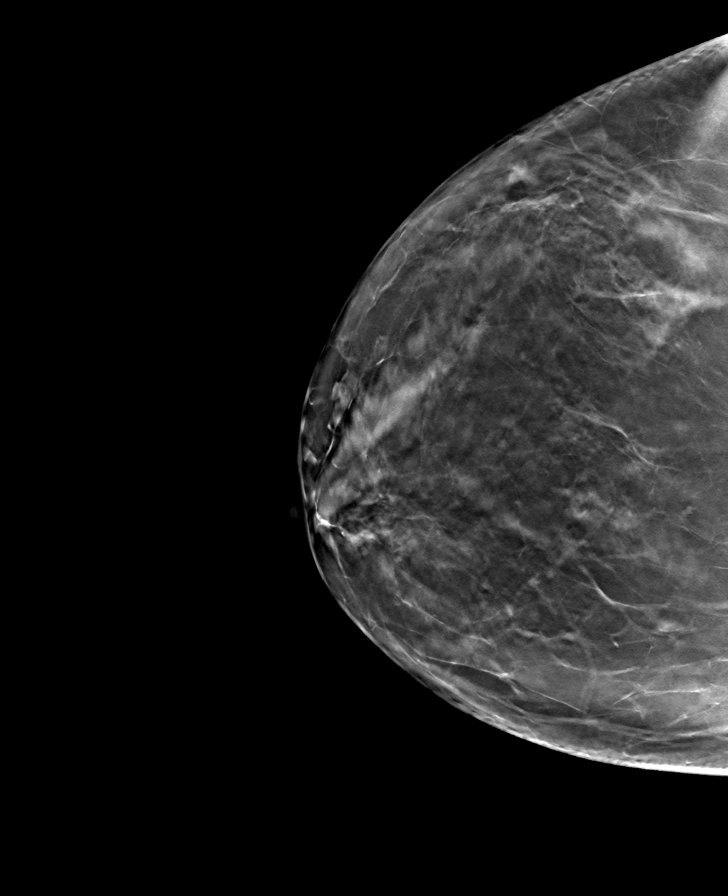

[8 of 24 positions shown; findings below may reference images not displayed]

ACR Breast Density Category b: There are scattered areas of
fibroglandular density.
FINDINGS: The focal asymmetry in the medial left breast seen in the CC
projection only is stable compared to mammograms of [DATE] and
[DATE]. No suspicious mass, distortion, or suspicious
microcalcification is identified in either breast.

Mammographic images were processed with CAD.

On physical exam, no mass is palpated in the medial left breast.

Targeted ultrasound is performed, showing normal fibroglandular
breast tissue in the upper inner quadrant and lower inner quadrant
of the left breast. No solid or cystic mass or abnormal shadowing is
identified.
IMPRESSION: Stable probably benign asymmetry medial left breast seen in the CC
projection only. Considerations include an island of fibroglandular
breast tissue or intramammary lymph node.

RECOMMENDATION:
Diagnostic left mammogram is recommended in 6 months. The option of
3D Affirm stereotactic biopsy was discussed with the patient. At
this time, the patient plans for follow-up mammogram in 6 months.

I have discussed the findings and recommendations with the patient.
Results were also provided in writing at the conclusion of the
visit. If applicable, a reminder letter will be sent to the patient
regarding the next appointment.

BI-RADS CATEGORY  3: Probably benign.

## 2016-01-12 ENCOUNTER — Other Ambulatory Visit: Payer: Self-pay | Admitting: Family Medicine

## 2016-01-12 NOTE — Telephone Encounter (Signed)
This is meant to be taken x12 weeks and then take a break.  We can restart this at a later date if needed but no refills at this time

## 2016-01-12 NOTE — Telephone Encounter (Signed)
Last OV 08/28/15 Phentermine last filled 09/02/15 #30 with 3

## 2016-03-07 ENCOUNTER — Other Ambulatory Visit: Payer: Self-pay | Admitting: Family Medicine

## 2016-03-08 NOTE — Telephone Encounter (Signed)
Med denied, pt needs to continue OTC vitamin D 2000iu.  

## 2016-04-12 ENCOUNTER — Encounter: Payer: Self-pay | Admitting: Family Medicine

## 2016-04-12 MED ORDER — SYNTHROID 100 MCG PO TABS: ORAL_TABLET | ORAL | Status: DC

## 2016-04-12 NOTE — Telephone Encounter (Signed)
Medication filled to pharmacy as requested.   

## 2016-05-31 ENCOUNTER — Encounter: Payer: Self-pay | Admitting: Family Medicine

## 2016-06-01 MED ORDER — LEVOTHYROXINE SODIUM 112 MCG PO TABS: 112.0000 ug | ORAL_TABLET | Freq: Every day | ORAL | 3 refills | Status: AC

## 2016-06-28 ENCOUNTER — Other Ambulatory Visit: Payer: Self-pay | Admitting: Obstetrics and Gynecology

## 2016-06-28 DIAGNOSIS — N63 Unspecified lump in unspecified breast: Secondary | ICD-10-CM

## 2016-06-28 DIAGNOSIS — N6489 Other specified disorders of breast: Secondary | ICD-10-CM

## 2016-07-08 ENCOUNTER — Other Ambulatory Visit: Payer: Self-pay | Admitting: Obstetrics and Gynecology

## 2016-07-08 ENCOUNTER — Ambulatory Visit
Admission: RE | Admit: 2016-07-08 | Discharge: 2016-07-08 | Disposition: A | Discharge status: Home / Self Care | Payer: 59 | Source: Ambulatory Visit | Attending: Obstetrics and Gynecology | Admitting: Obstetrics and Gynecology

## 2016-07-08 DIAGNOSIS — N6489 Other specified disorders of breast: Secondary | ICD-10-CM

## 2016-07-08 IMAGING — MG 2D DIGITAL DIAGNOSTIC UNILATERAL LEFT MAMMOGRAM WITH CAD AND ADJ
6 series · 6 of 14 positions shown · non-contrast
Comparison: Previous exam(s).

CLINICAL DATA: Followup for probably benign asymmetry in the medial
left breast.

EXAM:
2D DIGITAL DIAGNOSTIC UNILATERAL LEFT MAMMOGRAM WITH CAD AND ADJUNCT
TOMO
LEFT BREAST ULTRASOUND

[L CC]
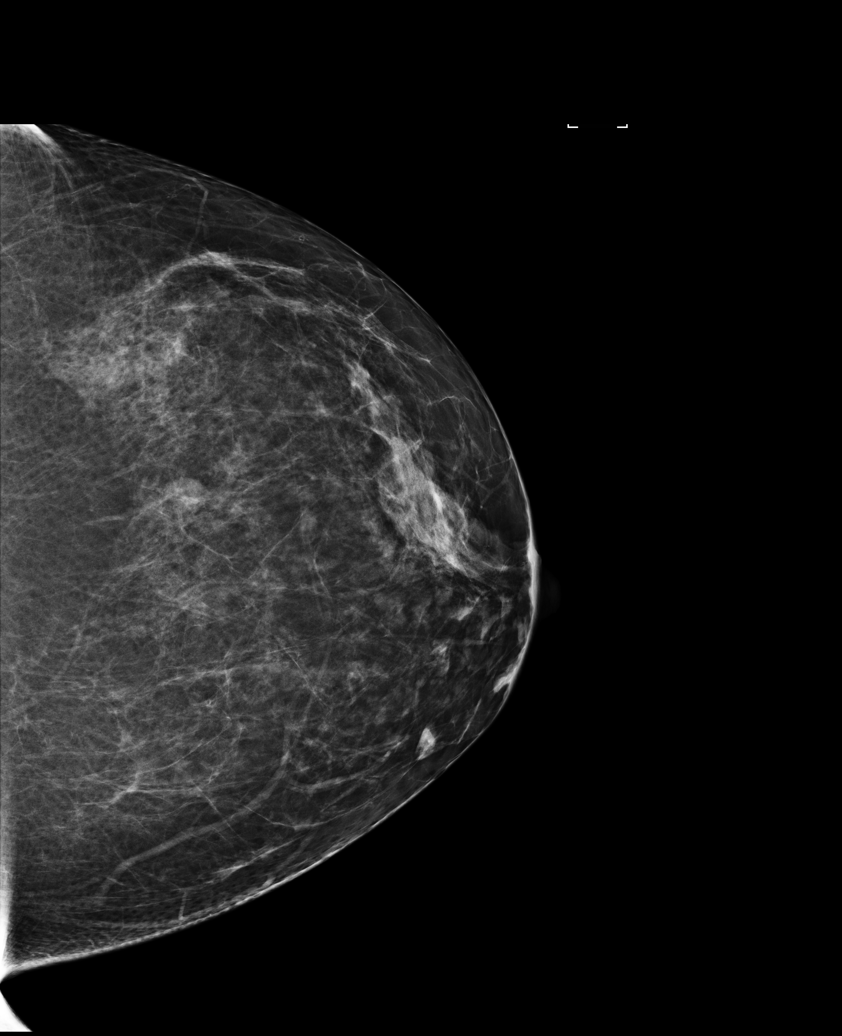

[L MLO synth-2D]
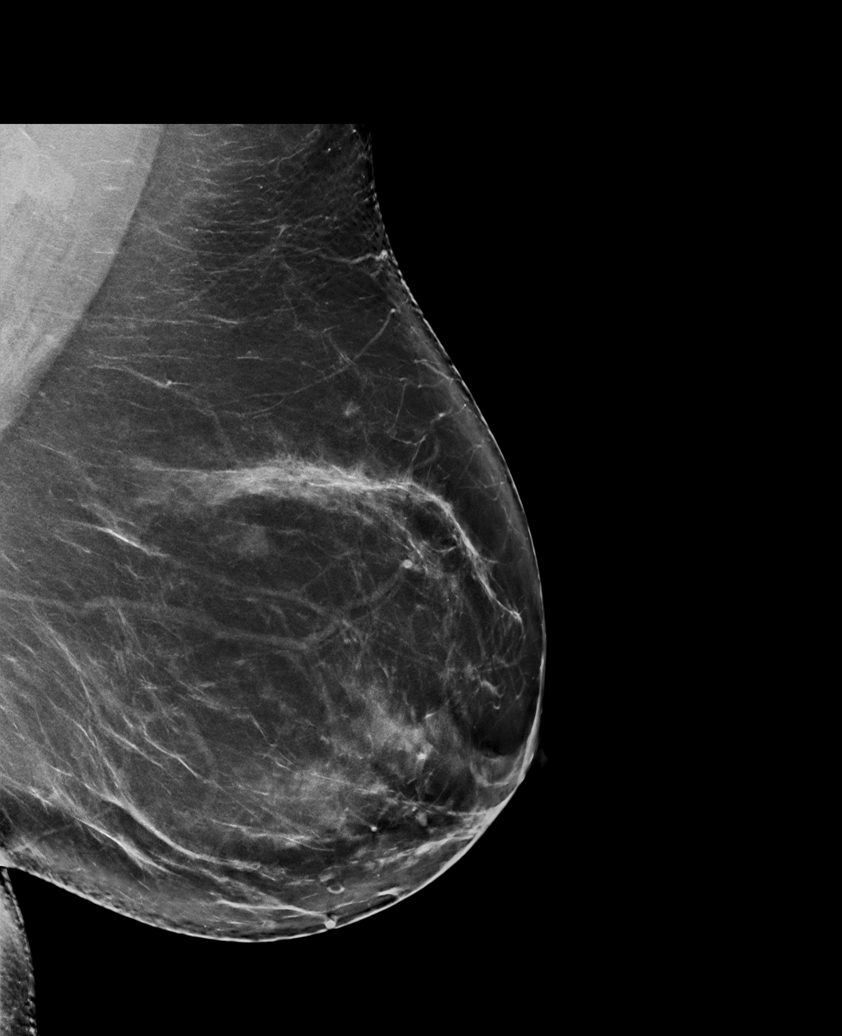

[L MLO]
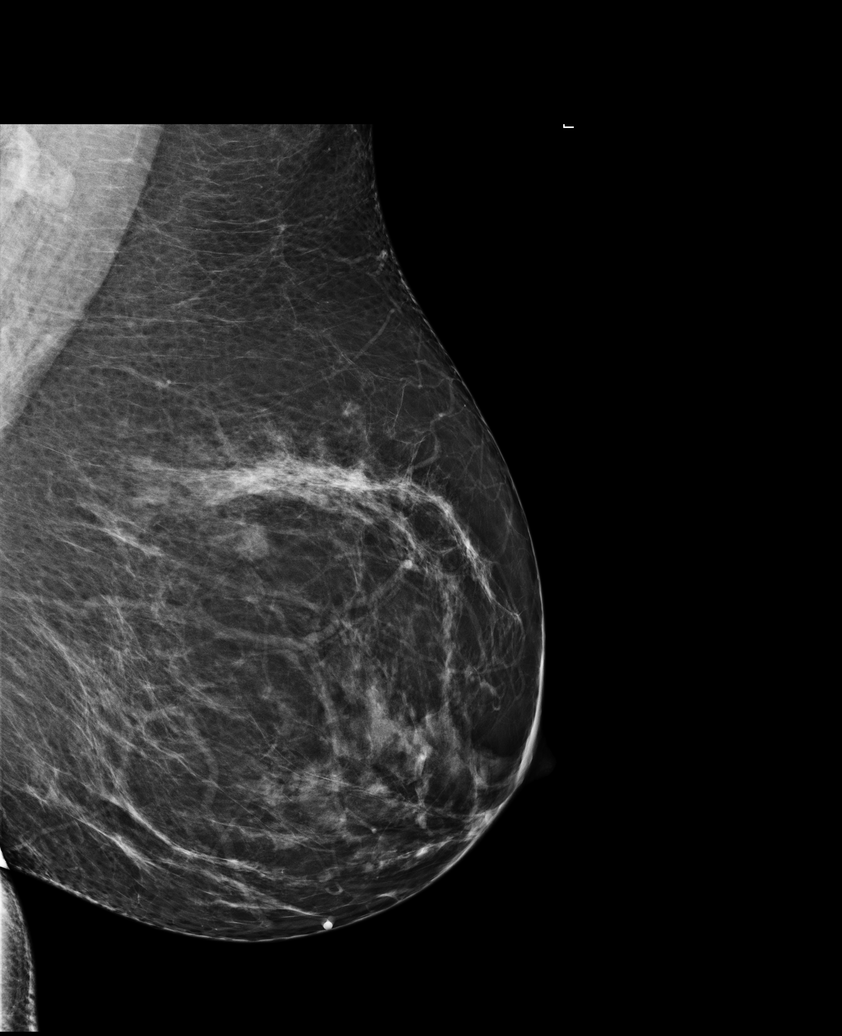

[L CC synth-2D]
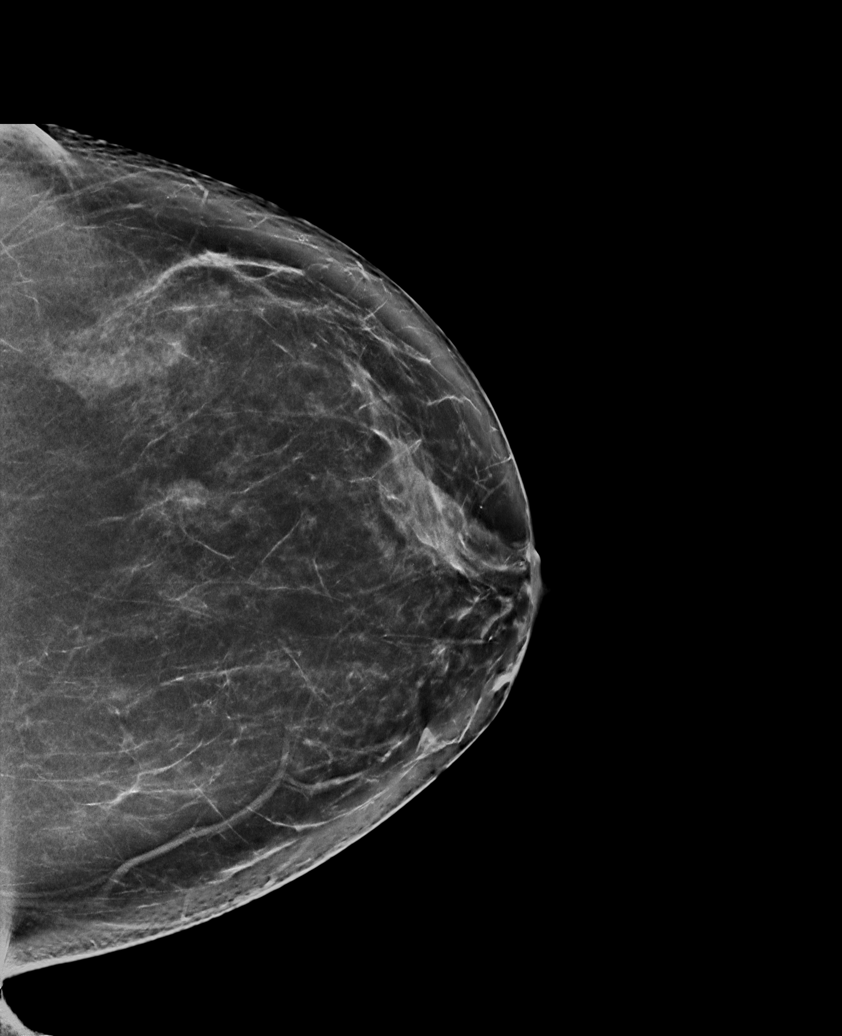

[L CC tomo · tomo slice 47/94.0]
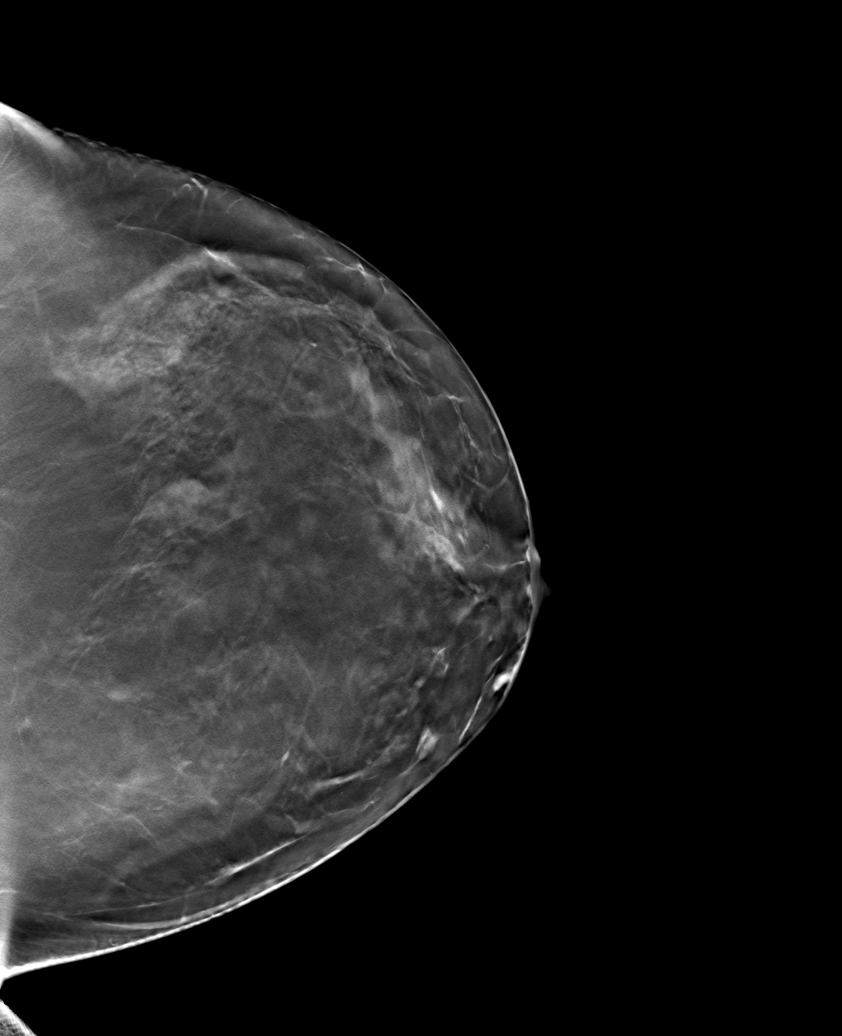

[L MLO tomo · tomo slice 50/99.0]
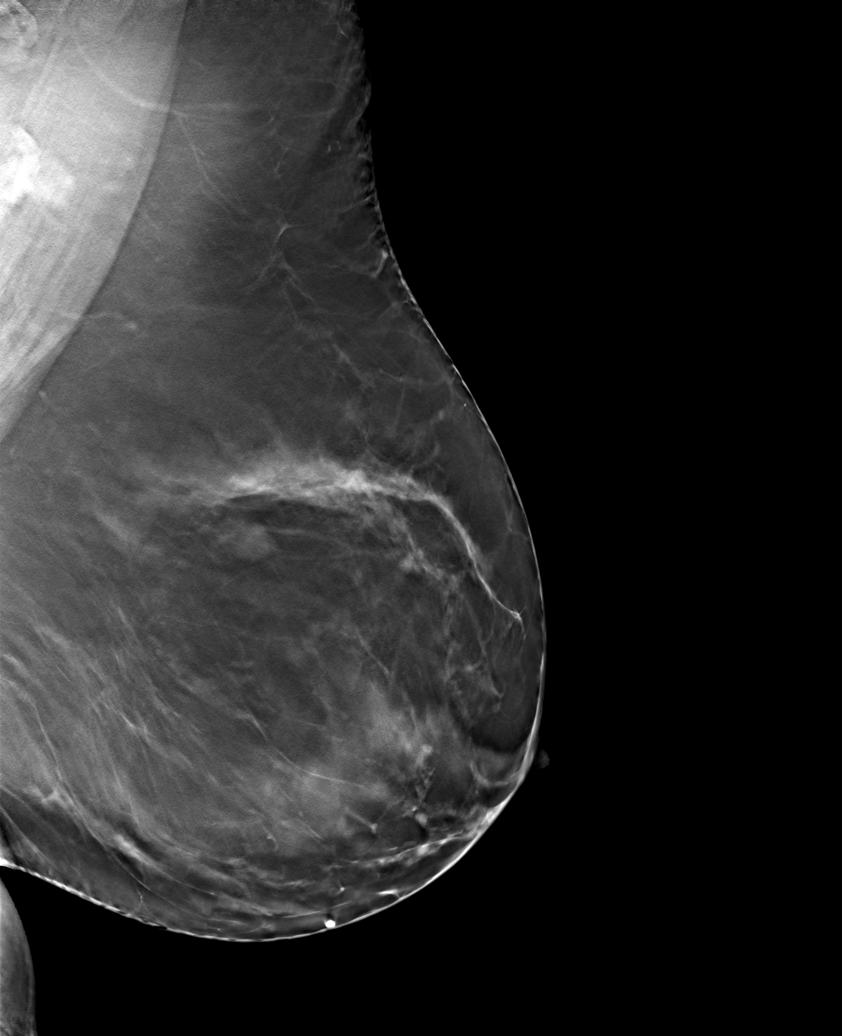

[6 of 14 positions shown; findings below may reference images not displayed]

ACR Breast Density Category b: There are scattered areas of
fibroglandular density.
FINDINGS: No suspicious masses or calcifications are seen in the left breast.
The probably benign asymmetry/oval mass in the inner left breast
measuring approximately 6-7 mm appears unchanged. There is no
mammographic evidence of malignancy in the left breast.

Mammographic images were processed with CAD.

Physical examination of the inner left breast does not reveal any
palpable masses.

Targeted ultrasound of the inner left breast was performed. No
discrete masses or abnormalities are seen, only normal appearing
fibroglandular tissue is visualized. There is no correlate
sonographically for the asymmetry/mass seen mammographically.
IMPRESSION: Stable probably benign left breast asymmetry.

RECOMMENDATION:
Diagnostic mammography of the bilateral breasts with possible left
breast ultrasound [DATE] which will demonstrate 2 years of
stability of the probably benign left breast asymmetry.

I have discussed the findings and recommendations with the patient.
Results were also provided in writing at the conclusion of the
visit. If applicable, a reminder letter will be sent to the patient
regarding the next appointment.

BI-RADS CATEGORY  3: Probably benign.

## 2016-09-01 ENCOUNTER — Encounter: Payer: 59 | Admitting: Family Medicine

## 2016-11-22 ENCOUNTER — Other Ambulatory Visit: Payer: Self-pay | Admitting: Obstetrics and Gynecology

## 2016-11-22 DIAGNOSIS — N6489 Other specified disorders of breast: Secondary | ICD-10-CM

## 2016-12-08 ENCOUNTER — Other Ambulatory Visit: Payer: 59

## 2016-12-14 ENCOUNTER — Ambulatory Visit
Admission: RE | Admit: 2016-12-14 | Discharge: 2016-12-14 | Disposition: A | Discharge status: Home / Self Care | Payer: 59 | Source: Ambulatory Visit | Attending: Obstetrics and Gynecology | Admitting: Obstetrics and Gynecology

## 2016-12-14 DIAGNOSIS — N6489 Other specified disorders of breast: Secondary | ICD-10-CM

## 2016-12-14 IMAGING — MG 2D DIGITAL DIAGNOSTIC BILATERAL MAMMOGRAM WITH CAD AND ADJUNCT T
8 of 12 series · 8 of 28 positions shown · non-contrast
Comparison: [DATE], [DATE], [DATE] ;prior study
performed at [HOSPITAL] in [place] [DATE] has
been requested within our system for comparison

ADDENDUM:
Comparison is now made with multiple prior studies including
[DATE], [DATE], [DATE], [U5], [U5], and [U5] and earlier
studies from [HOSPITAL]. The circumscribed oval mass in the medial
aspect of the left breast is stable compared with the studies from
[U5], conferring 2 years of stability. I discussed the findings with
the patient by telephone.

Recommendation:  Screening study is recommended in 1 year.
BI-RADS 2: Benign.
CLINICAL DATA: Two year followup for probably benign asymmetry in
the medial left breast.
EXAM:
2D DIGITAL DIAGNOSTIC BILATERAL MAMMOGRAM WITH CAD AND ADJUNCT TOMO

[R CC synth-2D]
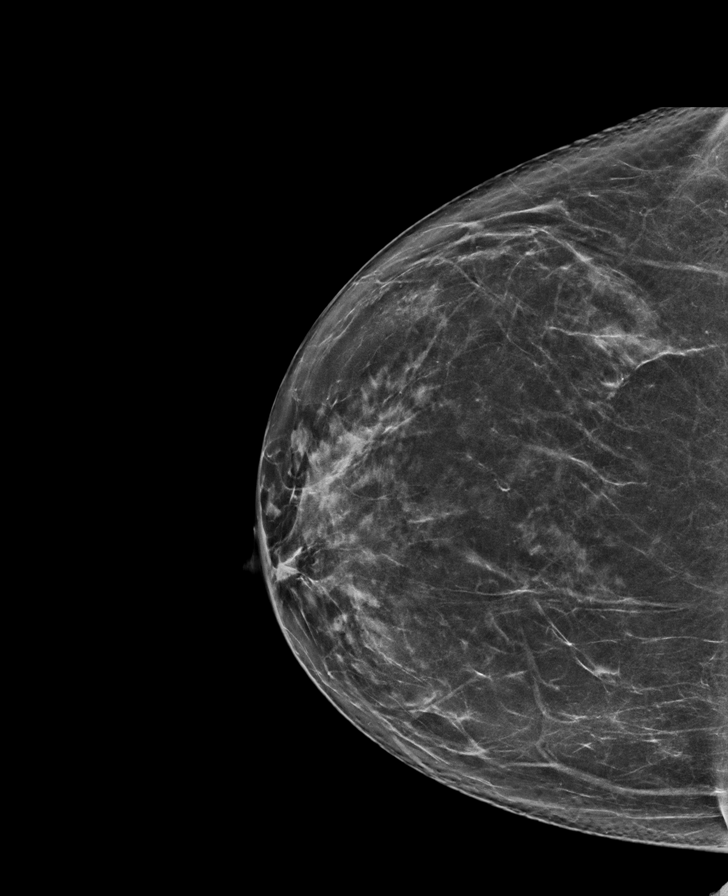

[L MLO synth-2D]
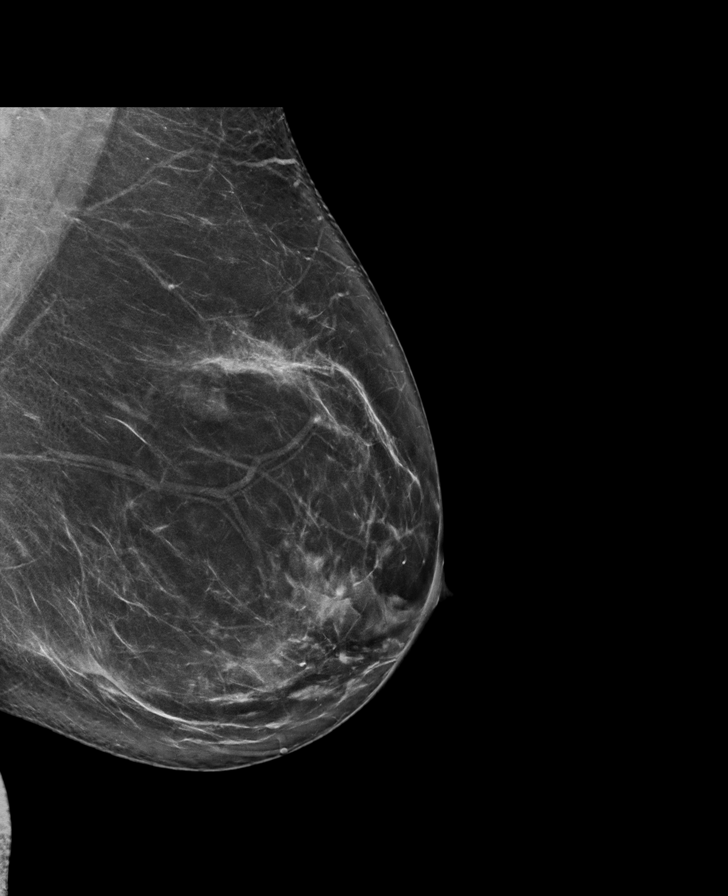

[L CC synth-2D]
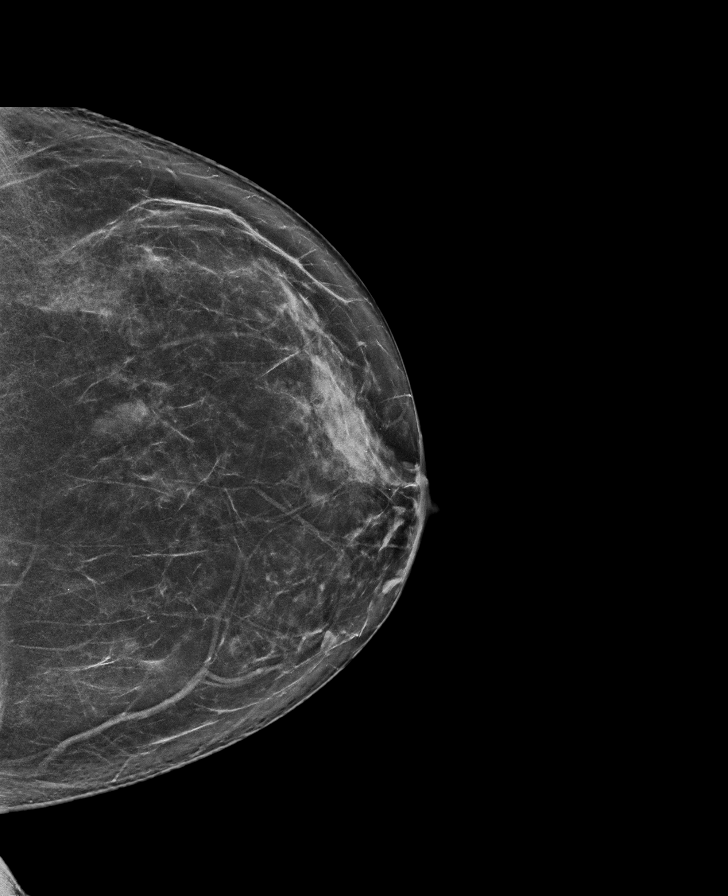

[R MLO]
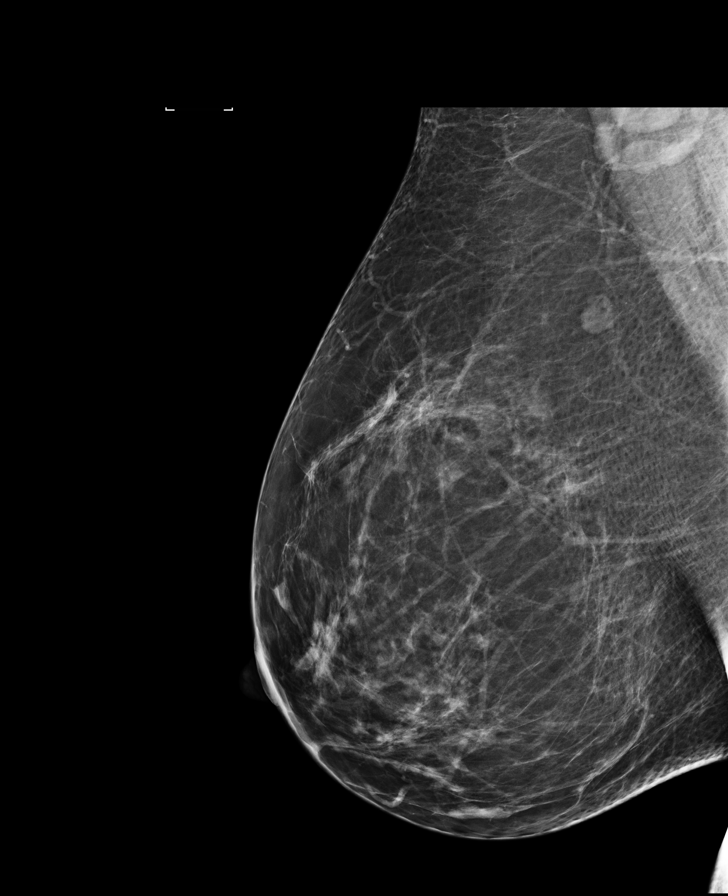

[L CC]
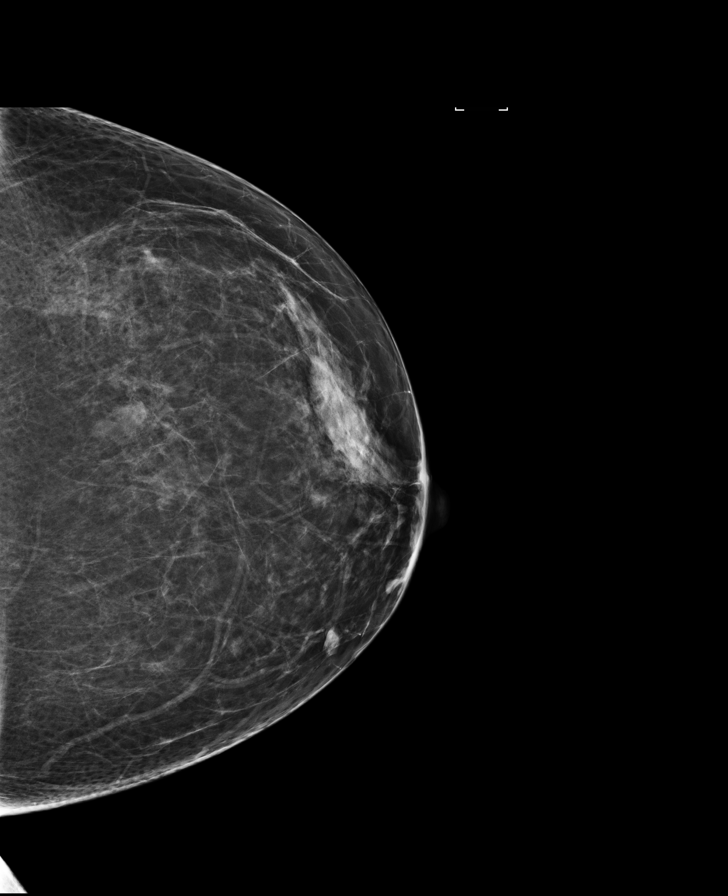

[R MLO synth-2D]
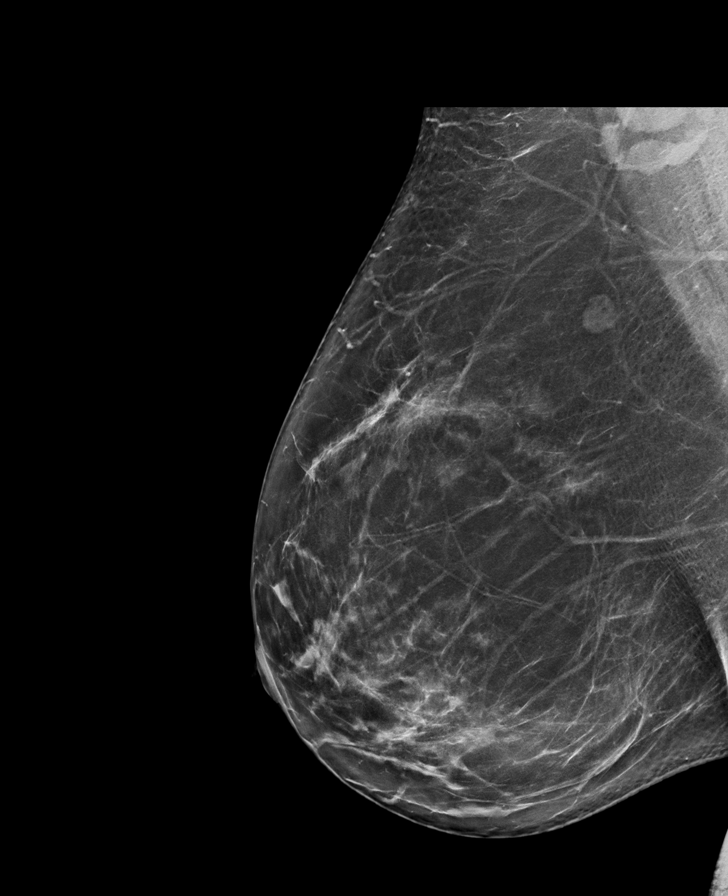

[R CC]
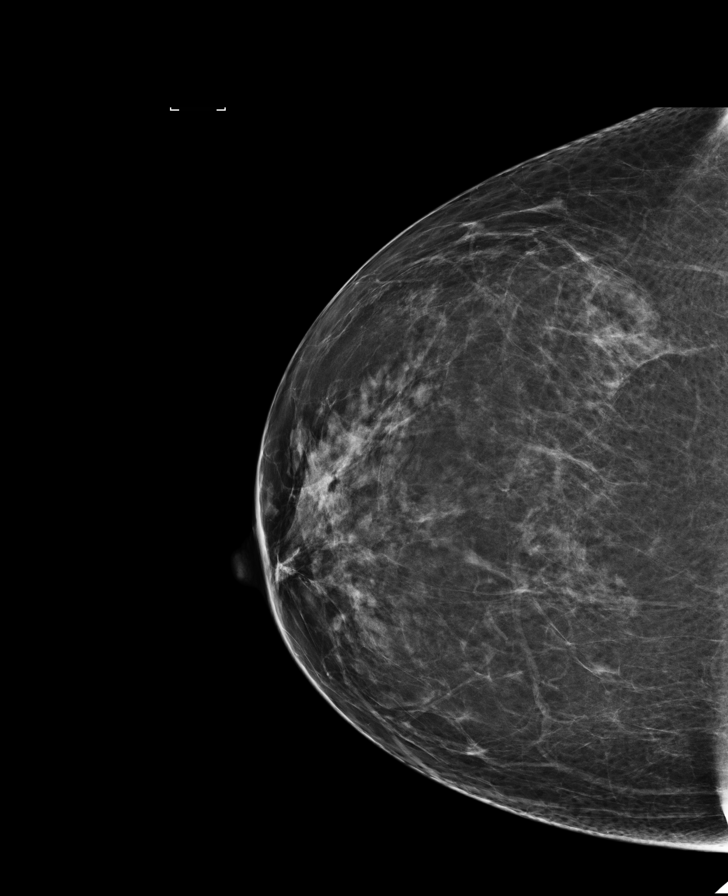

[L MLO]
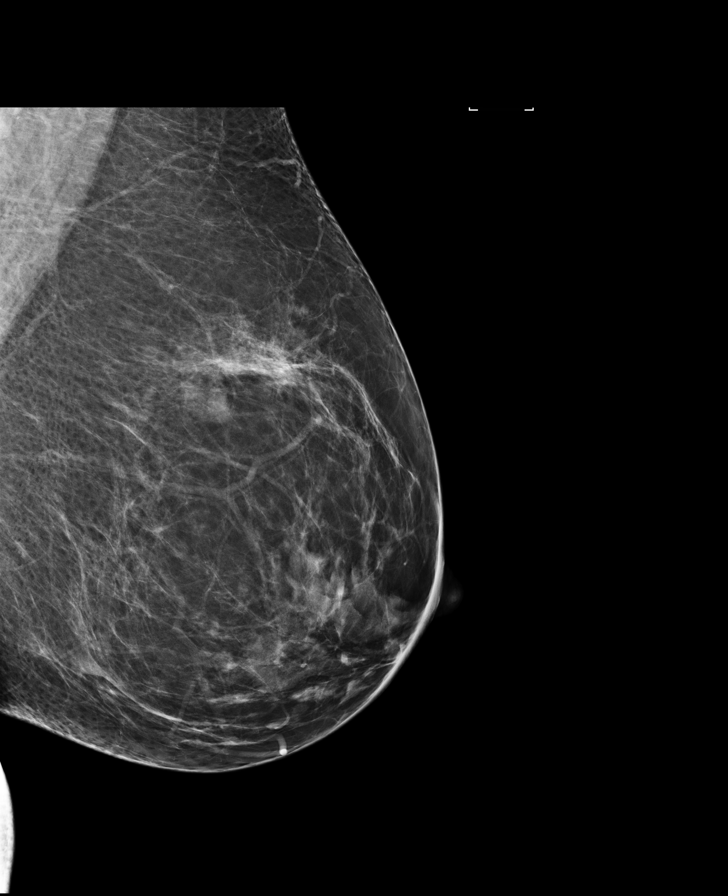

[8 of 28 positions shown; findings below may reference images not displayed]

ACR Breast Density Category b: There are scattered areas of
fibroglandular density.
FINDINGS: Within the medial aspect of the left breast, there is a
circumscribed oval mass, stable in appearance over recent exams. No
sonographic correlate for this mass has been identified in the
medial aspect of the left breast . No new mass, distortion, or
suspicious microcalcifications are identified in either breast.

Mammographic images were processed with CAD.
IMPRESSION: 1.  No mammographic evidence for malignancy.
2. Benign-appearing nodule in the medial aspect of left breast is
stable over recent exams.

RECOMMENDATION:
1. Comparison with study from [DATE] when available. An addendum
will be made regarding recommendations.
2. If 2 year stability can be confirmed, screening mammogram is
recommended in 1 year.
3. I will contact the patient regarding recommendations after
comparison with the [facility] study.

I have discussed the findings and recommendations with the patient.
Results were also provided in writing at the conclusion of the
visit. If applicable, a reminder letter will be sent to the patient
regarding the next appointment.

BI-RADS CATEGORY  0: Incomplete. Need additional imaging evaluation
and/or prior mammograms for comparison.

## 2016-12-15 ENCOUNTER — Other Ambulatory Visit: Payer: 59

## 2017-12-19 ENCOUNTER — Other Ambulatory Visit: Payer: Self-pay | Admitting: Obstetrics and Gynecology

## 2017-12-19 DIAGNOSIS — Z1231 Encounter for screening mammogram for malignant neoplasm of breast: Secondary | ICD-10-CM

## 2018-01-05 ENCOUNTER — Ambulatory Visit: Payer: 59

## 2018-01-12 ENCOUNTER — Ambulatory Visit
Admission: RE | Admit: 2018-01-12 | Discharge: 2018-01-12 | Disposition: A | Discharge status: Home / Self Care | Payer: 59 | Source: Ambulatory Visit | Attending: Obstetrics and Gynecology | Admitting: Obstetrics and Gynecology

## 2018-01-12 DIAGNOSIS — Z1231 Encounter for screening mammogram for malignant neoplasm of breast: Secondary | ICD-10-CM

## 2018-01-12 IMAGING — MG DIGITAL SCREENING BILATERAL MAMMOGRAM WITH TOMO AND CAD
8 series · 8 of 24 positions shown · non-contrast
Comparison: Previous exam(s).

CLINICAL DATA: Screening.

EXAM:
DIGITAL SCREENING BILATERAL MAMMOGRAM WITH TOMO AND CAD

[R CC synth-2D]
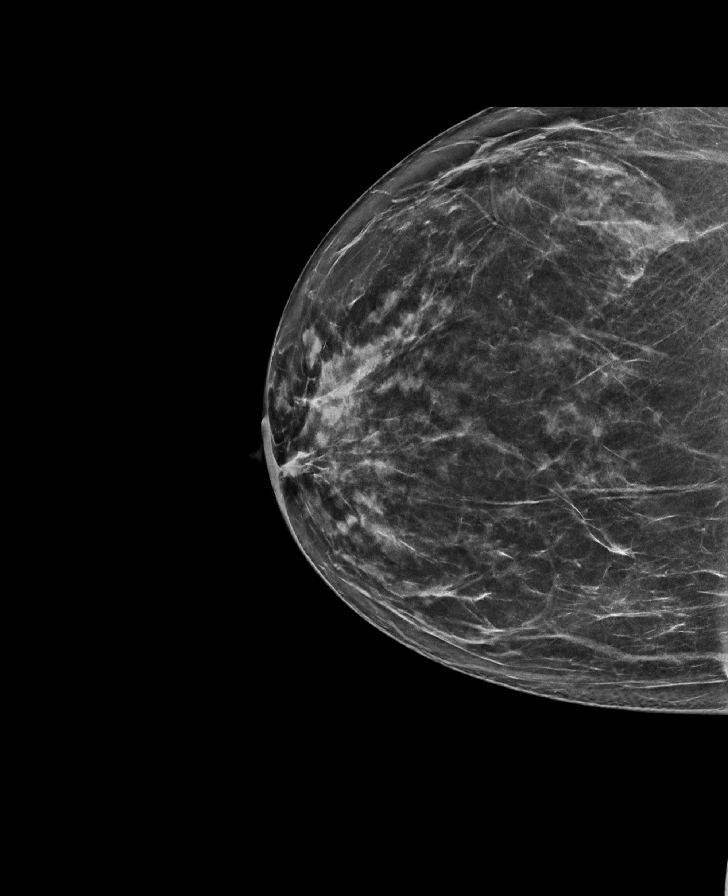

[L CC synth-2D]
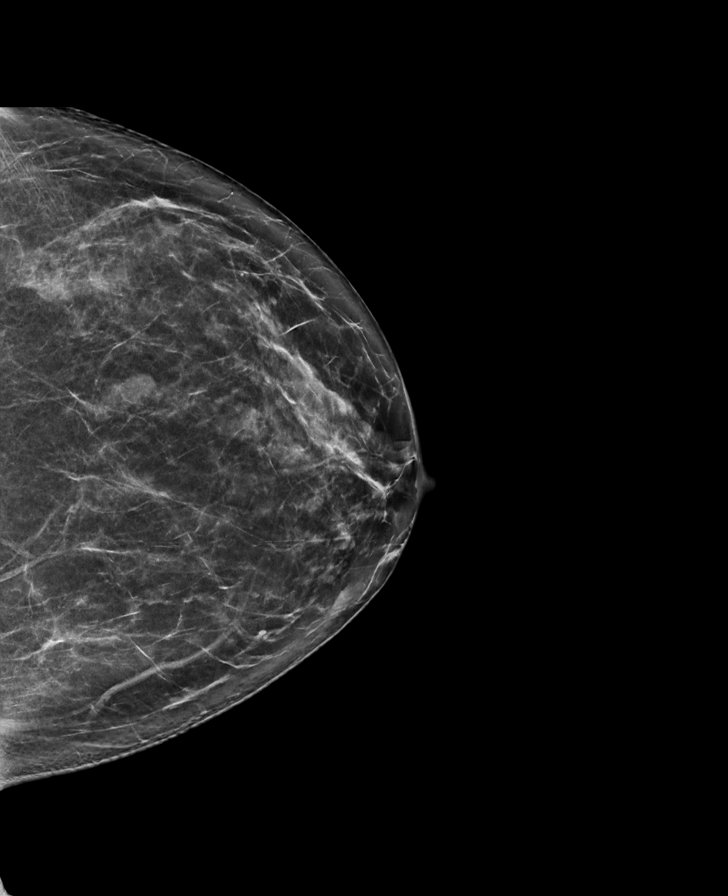

[R MLO synth-2D]
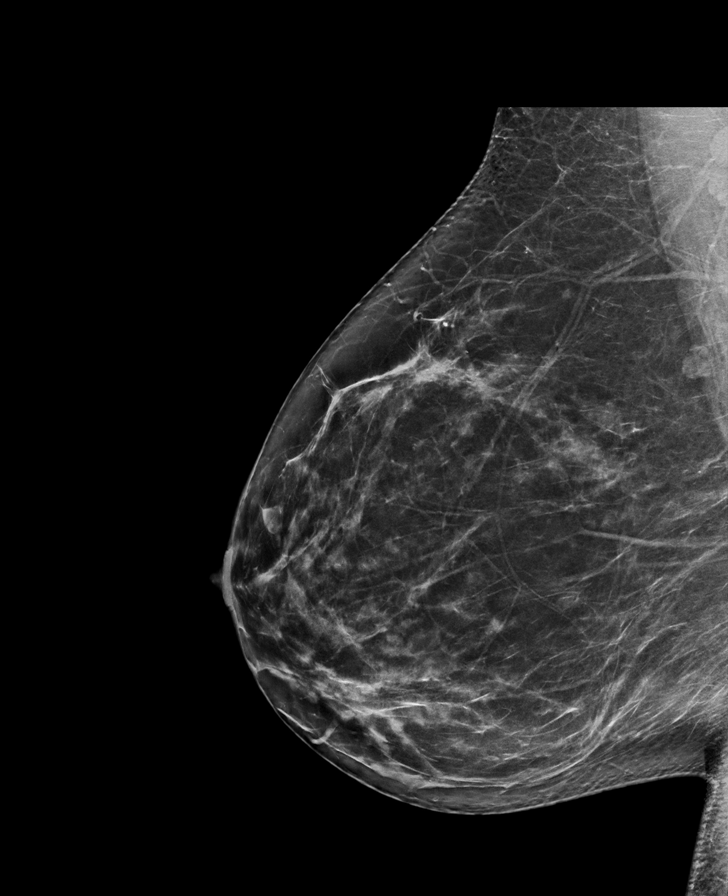

[L MLO synth-2D]
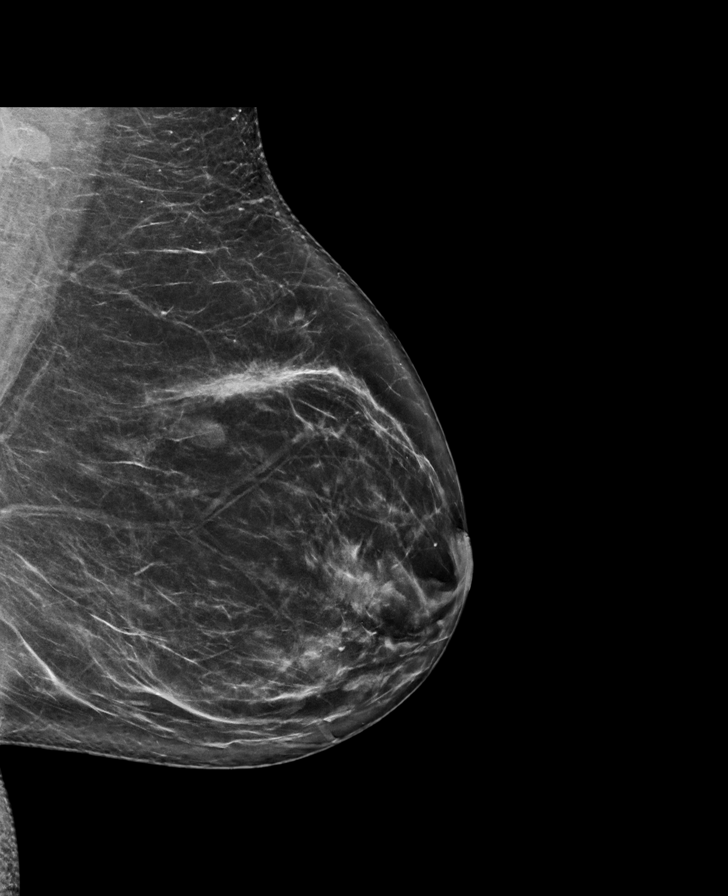

[R MLO tomo · tomo slice 40/79.0]
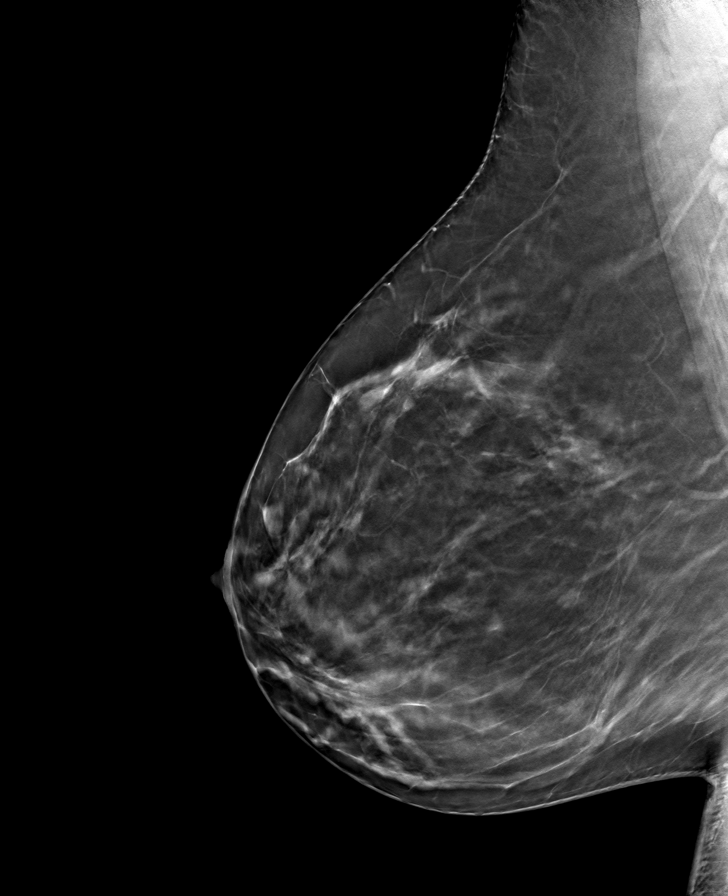

[L CC tomo · tomo slice 39/78.0]
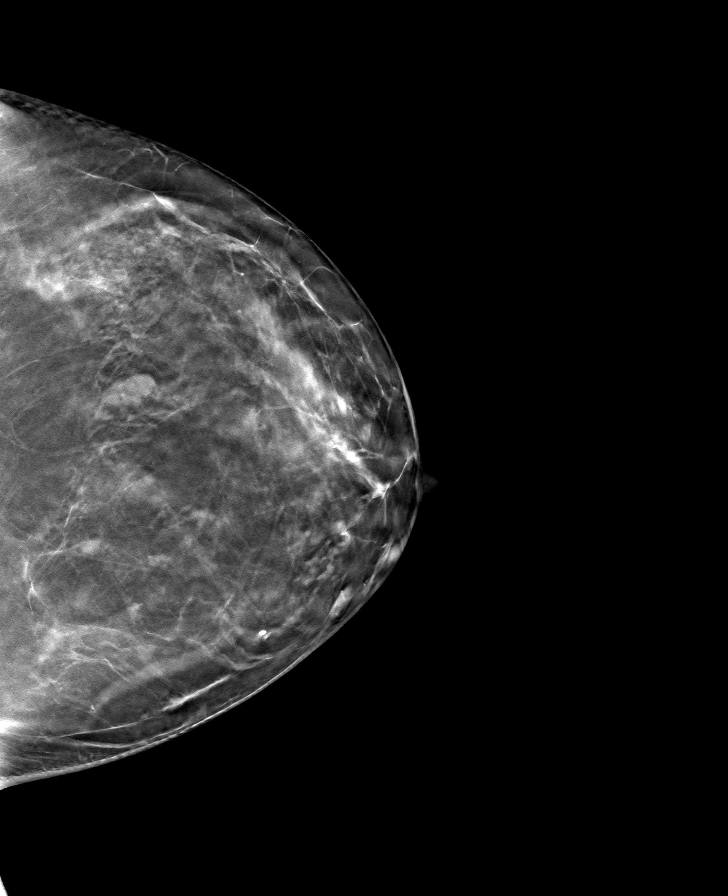

[R CC tomo · tomo slice 38/75.0]
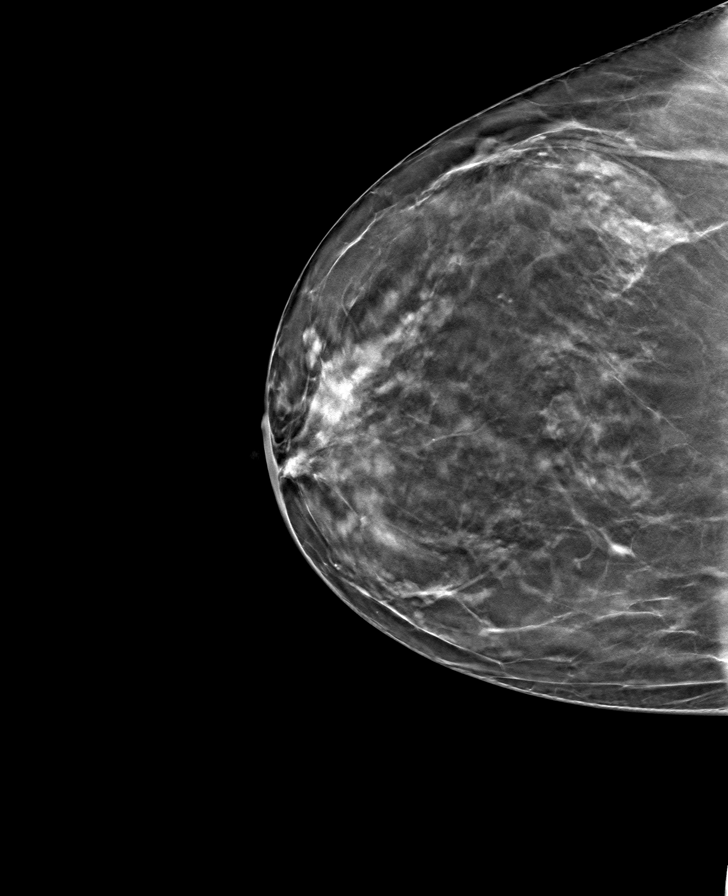

[L MLO tomo · tomo slice 41/81.0]
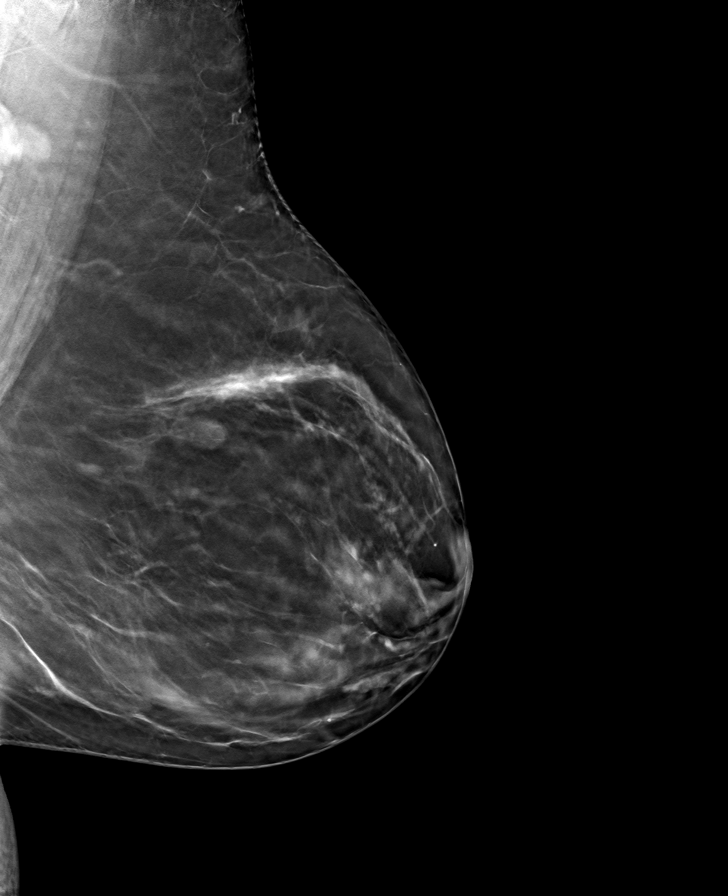

[8 of 24 positions shown; findings below may reference images not displayed]

ACR Breast Density Category b: There are scattered areas of
fibroglandular density.
FINDINGS: There are no findings suspicious for malignancy. Images were
processed with CAD.
IMPRESSION: No mammographic evidence of malignancy. A result letter of this
screening mammogram will be mailed directly to the patient.

RECOMMENDATION:
Screening mammogram in one year. (Code:[TQ])

BI-RADS CATEGORY  1: Negative.

## 2018-12-28 ENCOUNTER — Other Ambulatory Visit: Payer: Self-pay | Admitting: Obstetrics and Gynecology

## 2018-12-28 DIAGNOSIS — Z1231 Encounter for screening mammogram for malignant neoplasm of breast: Secondary | ICD-10-CM

## 2019-01-21 ENCOUNTER — Ambulatory Visit: Payer: 59

## 2019-02-11 ENCOUNTER — Ambulatory Visit: Payer: Self-pay

## 2019-04-19 ENCOUNTER — Other Ambulatory Visit: Payer: Self-pay

## 2019-04-19 ENCOUNTER — Ambulatory Visit
Admission: RE | Admit: 2019-04-19 | Discharge: 2019-04-19 | Disposition: A | Discharge status: Home / Self Care | Payer: PRIVATE HEALTH INSURANCE | Source: Ambulatory Visit | Attending: Obstetrics and Gynecology | Admitting: Obstetrics and Gynecology

## 2019-04-19 DIAGNOSIS — Z1231 Encounter for screening mammogram for malignant neoplasm of breast: Secondary | ICD-10-CM

## 2019-04-19 IMAGING — MG DIGITAL SCREENING BILATERAL MAMMOGRAM WITH TOMO AND CAD
8 series · 8 of 24 positions shown · non-contrast
Comparison: Previous exam(s).

CLINICAL DATA: Screening.

EXAM:
DIGITAL SCREENING BILATERAL MAMMOGRAM WITH TOMO AND CAD

[R MLO synth-2D]
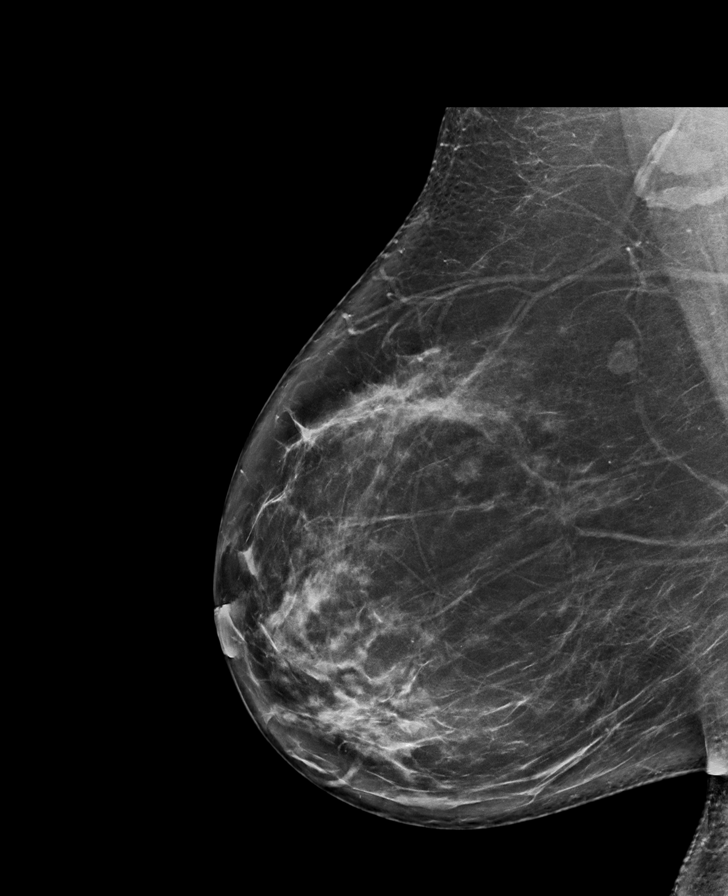

[L CC synth-2D]
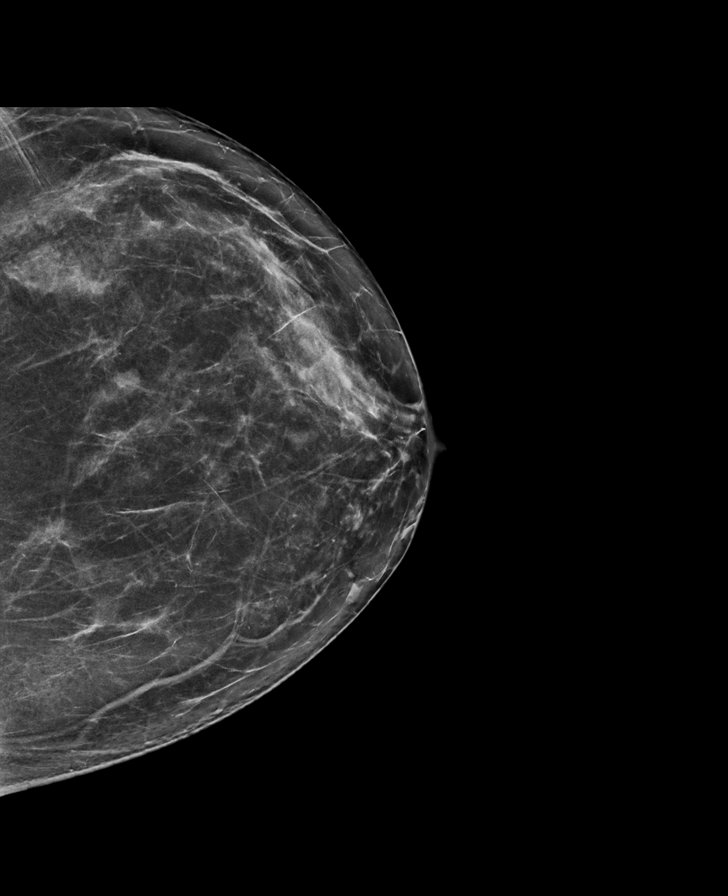

[L MLO synth-2D]
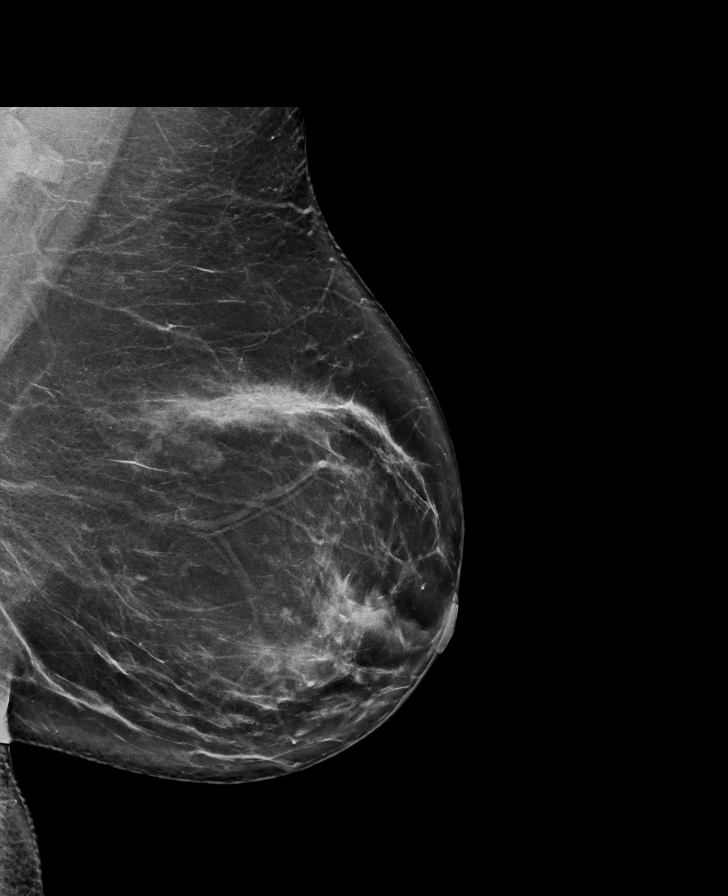

[R CC synth-2D]
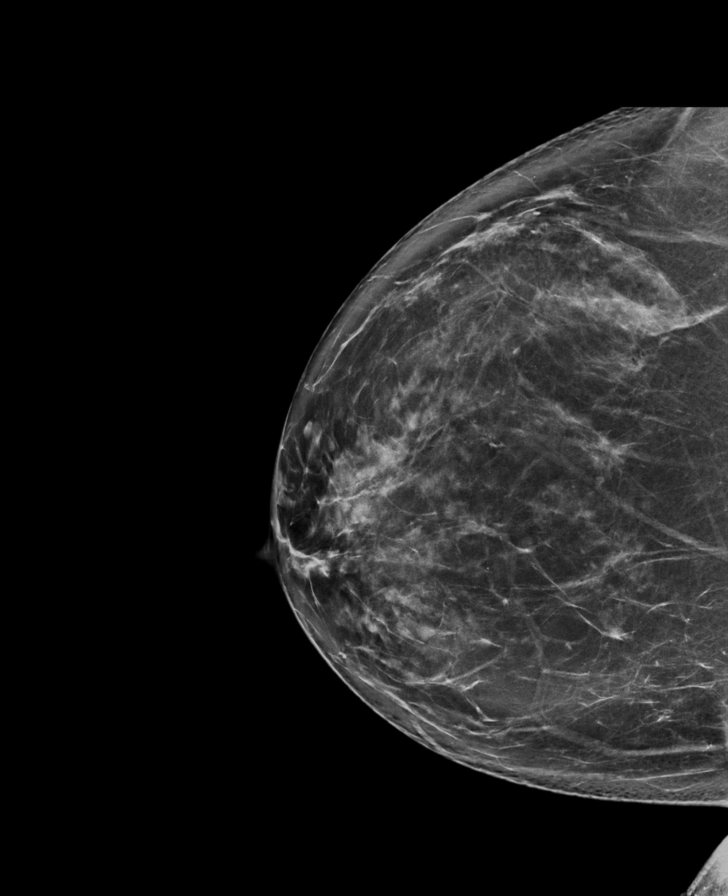

[R MLO tomo · tomo slice 47/93.0]
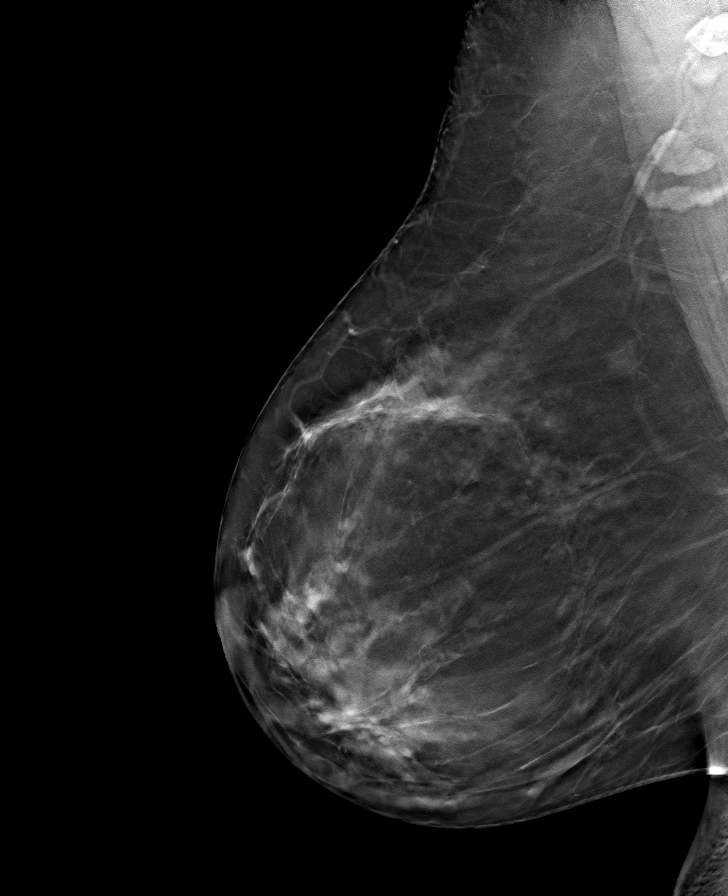

[L MLO tomo · tomo slice 47/94.0]
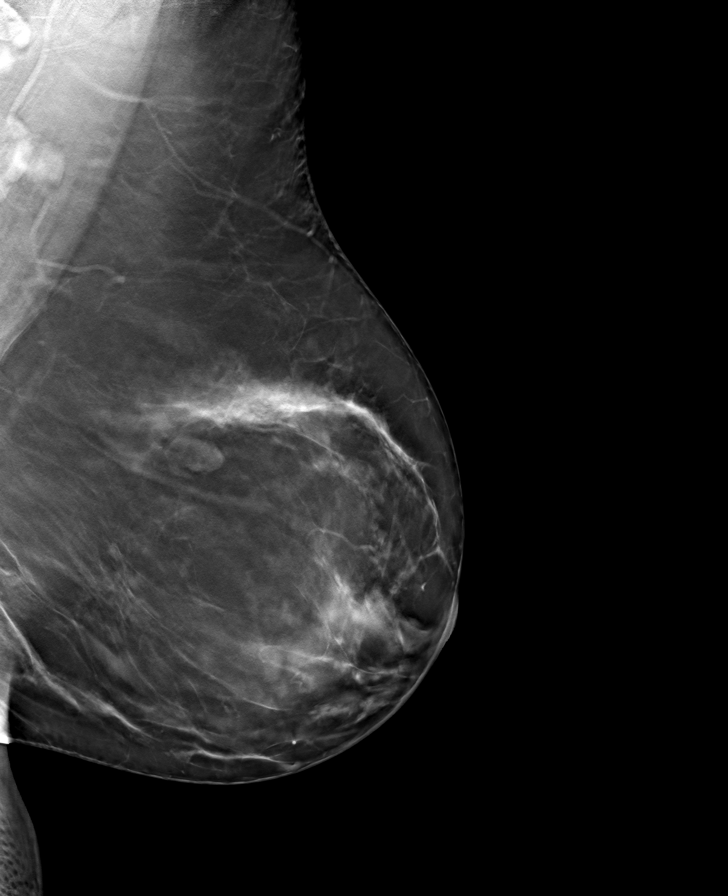

[R CC tomo · tomo slice 43/85.0]
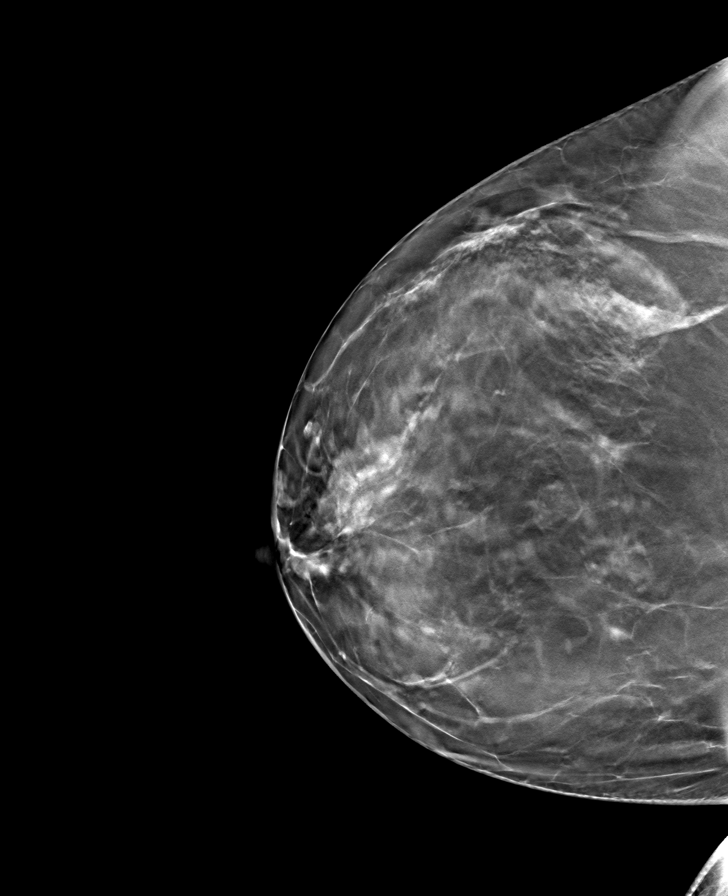

[L CC tomo · tomo slice 40/79.0]
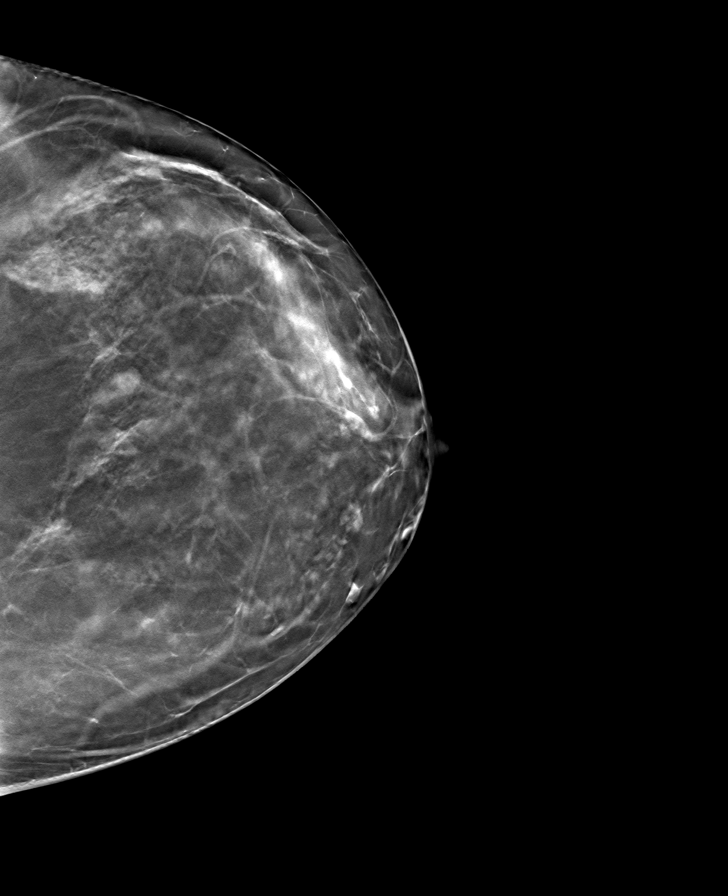

[8 of 24 positions shown; findings below may reference images not displayed]

ACR Breast Density Category b: There are scattered areas of
fibroglandular density.
FINDINGS: There are no findings suspicious for malignancy. Images were
processed with CAD.
IMPRESSION: No mammographic evidence of malignancy. A result letter of this
screening mammogram will be mailed directly to the patient.

RECOMMENDATION:
Screening mammogram in one year. (Code:[TQ])

BI-RADS CATEGORY  1: Negative.

## 2019-10-01 ENCOUNTER — Other Ambulatory Visit: Payer: Self-pay | Admitting: Obstetrics and Gynecology

## 2019-10-01 DIAGNOSIS — Z9189 Other specified personal risk factors, not elsewhere classified: Secondary | ICD-10-CM

## 2019-11-06 ENCOUNTER — Ambulatory Visit
Admission: RE | Admit: 2019-11-06 | Discharge: 2019-11-06 | Disposition: A | Discharge status: Home / Self Care | Payer: PRIVATE HEALTH INSURANCE | Source: Ambulatory Visit | Attending: Obstetrics and Gynecology | Admitting: Obstetrics and Gynecology

## 2019-11-06 DIAGNOSIS — Z9189 Other specified personal risk factors, not elsewhere classified: Secondary | ICD-10-CM

## 2019-11-06 IMAGING — MR MR BREAST BILAT WO/W CM
8 of 12 series · 33 of 48 positions shown · IV contrast (7 ml gadavist)
Comparison: Previous exam(s).

CLINICAL DATA: 53-year-old female presenting for high risk
screening MRI. Family history of breast cancer in maternal
grandmother at age 60.

LABS:  None performed on the site.
EXAM:
BILATERAL BREAST MRI WITH AND WITHOUT CONTRAST
TECHNIQUE: Multiplanar, multisequence MR images of both breasts were obtained
prior to and following the intravenous administration of 7 ml of
Gadavist.

[Series 2: t2_tirm_tra ipat (a-p) · axial · 3.0mm · 0.72mm/px · 1 of 55 slices shown]
[im 1/55]
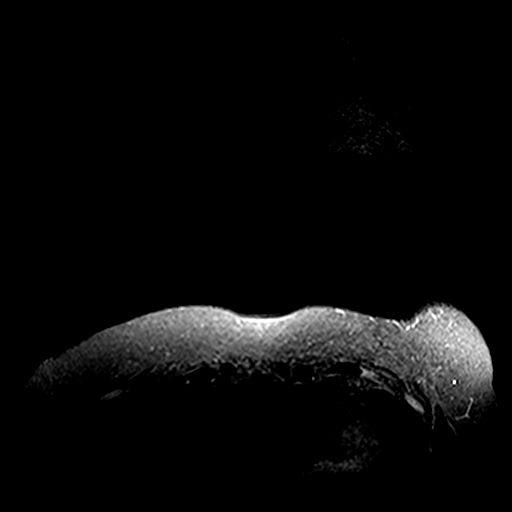

[Series 3: fl3d pre-cm no · axial · non-contrast · 1.2mm · 0.96mm/px · z∈[-72,+100]mm · 5 of 144 slices shown]
[im 1/144]
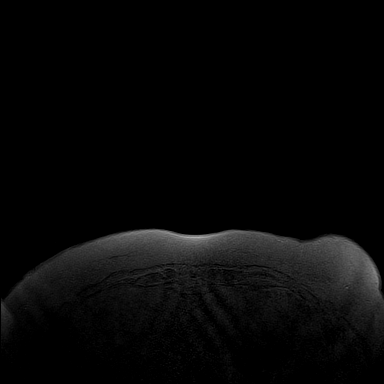
[im 36/144]
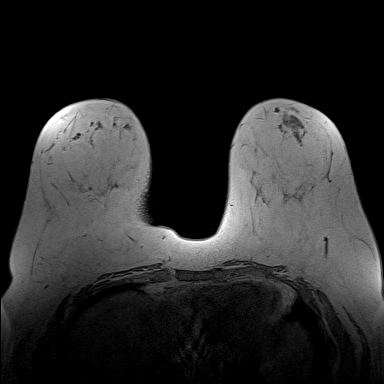
[im 72/144]
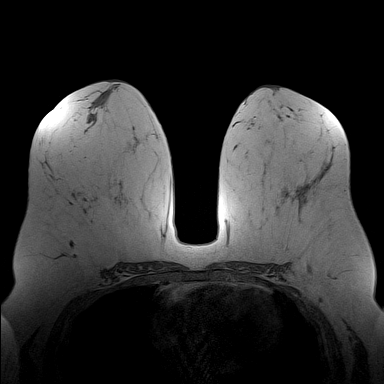
[im 108/144]
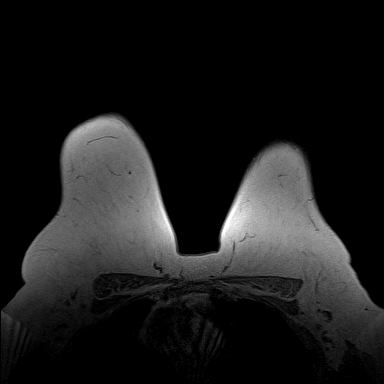
[im 144/144]
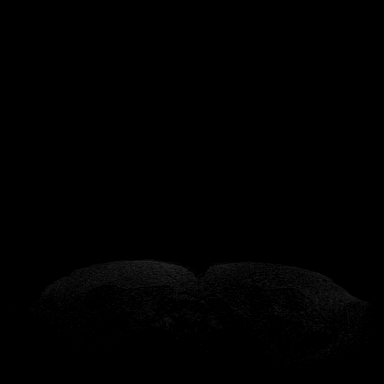

[Series 4: fl3d pre-cm · axial · non-contrast · 1.2mm · 0.96mm/px · z∈[-72,+100]mm · 5 of 144 slices shown]
[im 1/144]
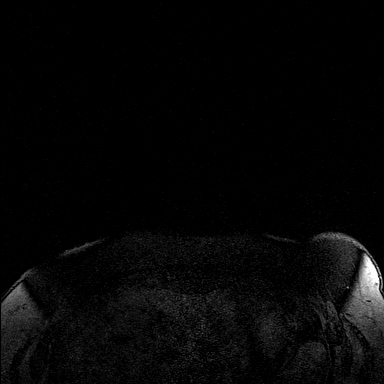
[im 36/144]
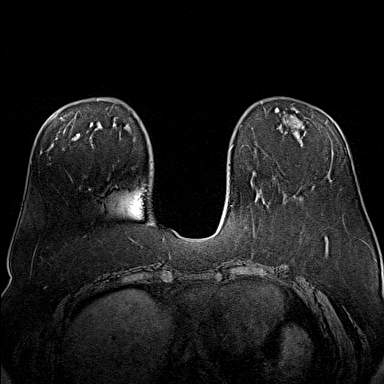
[im 72/144]
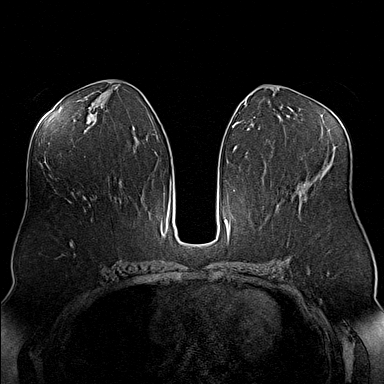
[im 108/144]
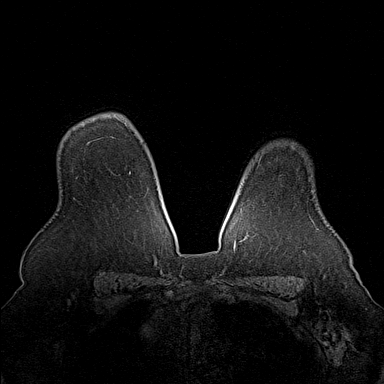
[im 144/144]
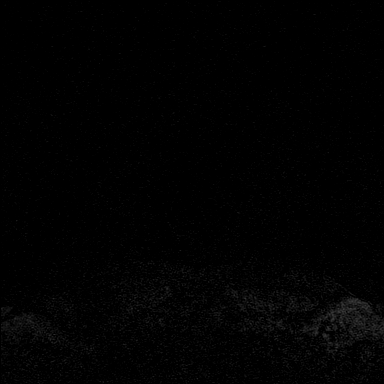

[Series 5: fl3d post-cm 20 · axial · 1.2mm · 0.96mm/px · z∈[-72,+100]mm · 5 of 144 slices shown (1 of 3)]
[im 1/144]
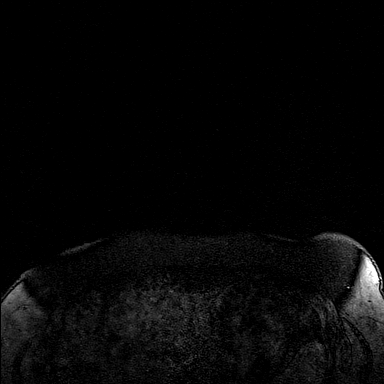
[im 36/144]
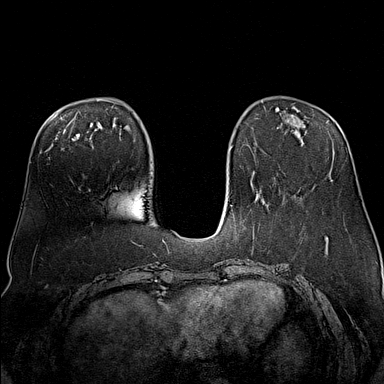
[im 72/144]
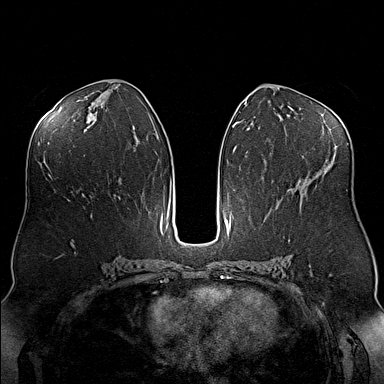
[im 108/144]
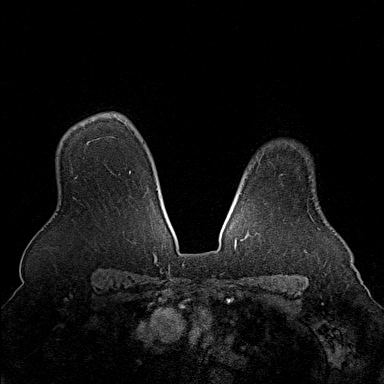
[im 144/144]
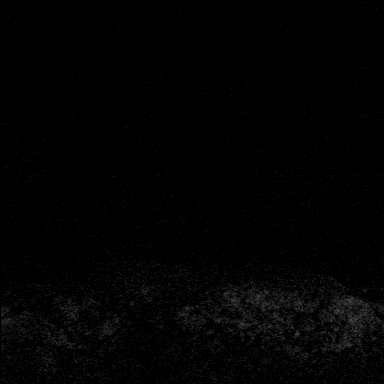

[Series 6: fl3d post-cm 20 · axial · 1.2mm · 0.96mm/px · z∈[-72,+100]mm · 5 of 144 slices shown (2 of 3)]
[im 1/144]
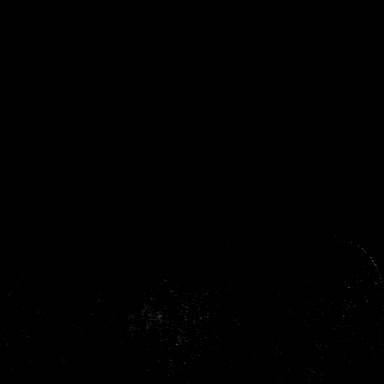
[im 36/144]
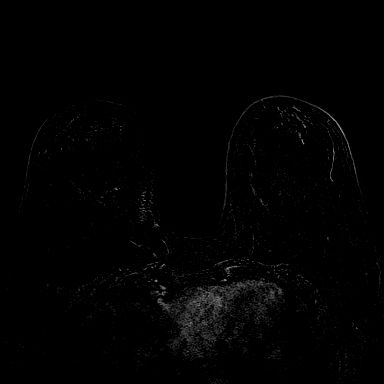
[im 72/144]
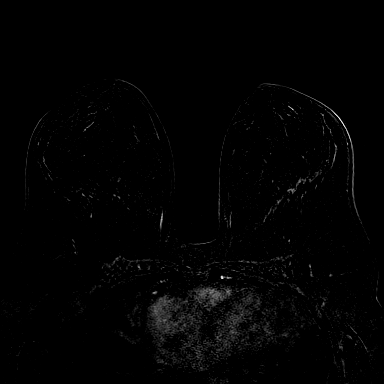
[im 108/144]
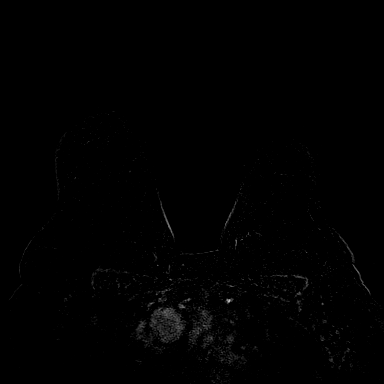
[im 144/144]
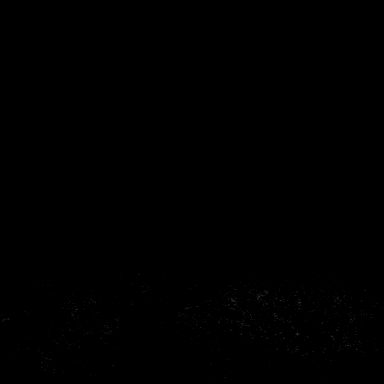

[Series 7: fl3d post-cm 20 · axial · 172.8mm · 0.96mm/px · 1 of 1 slices shown (3 of 3)]
[im 1/1]
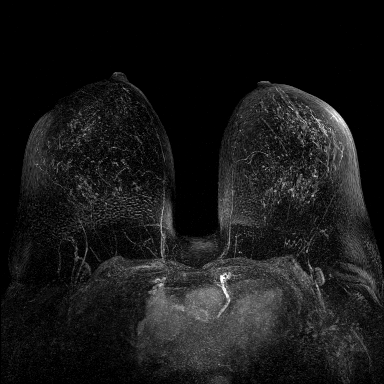

[Series 8: fl3d post-cm 3min · axial · 1.2mm · 0.96mm/px · z∈[-72,+100]mm · 6 of 144 slices shown]
[im 1/144]
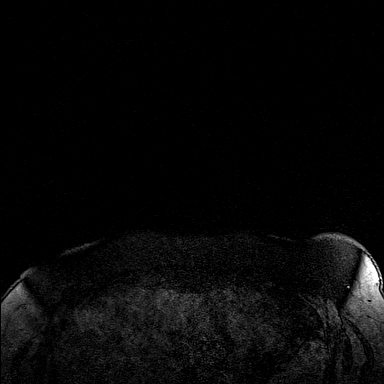
[im 29/144]
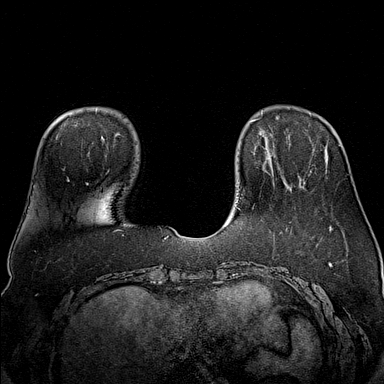
[im 58/144]
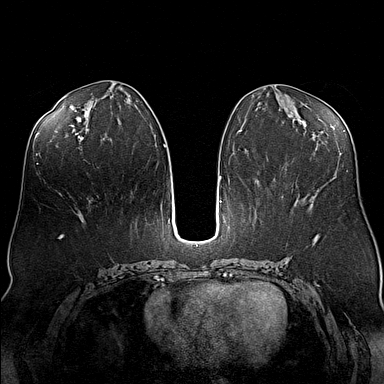
[im 86/144]
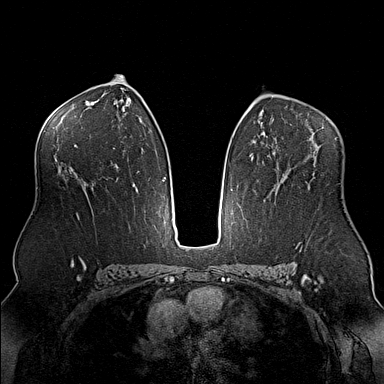
[im 115/144]
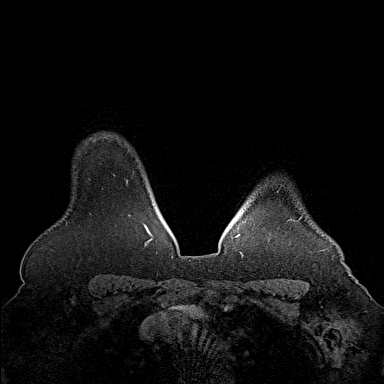
[im 144/144]
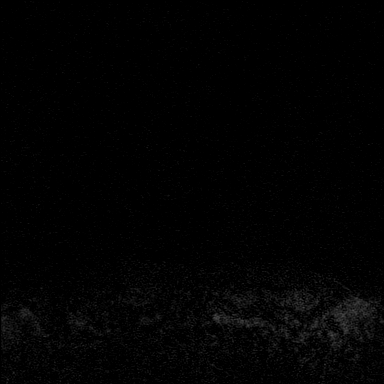

[Series 9: fl3d post-cm 3min_sub · axial · 1.2mm · 0.96mm/px · z∈[-72,+65]mm · 5 of 144 slices shown]
[im 1/144]
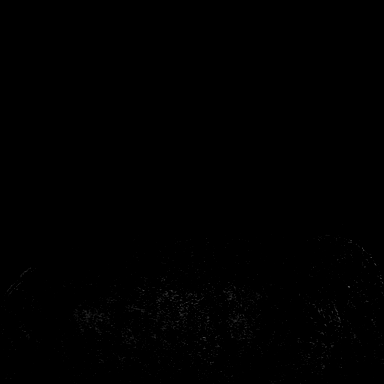
[im 29/144]
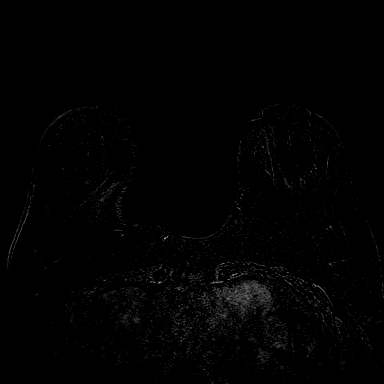
[im 58/144]
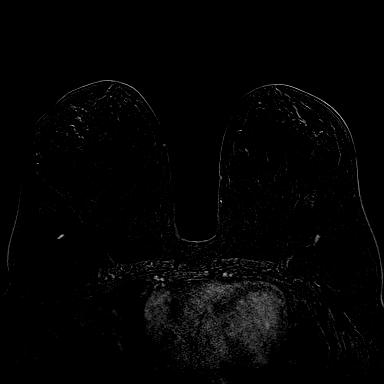
[im 86/144]
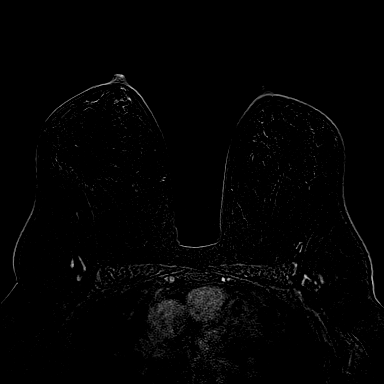
[im 115/144]
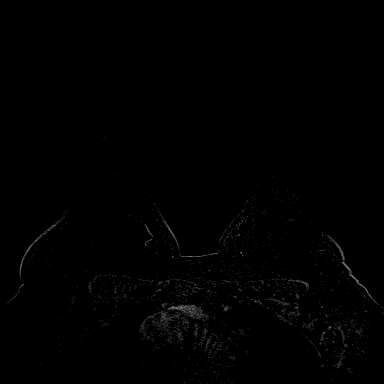

[33 of 48 positions shown; findings below may reference images not displayed]

Three-dimensional MR images were rendered by post-processing of the
original MR data on an independent workstation. The
three-dimensional MR images were interpreted, and findings are
reported in the following complete MRI report for this study. Three
dimensional images were evaluated at the independent [employer]
workstation
FINDINGS: Breast composition: b. Scattered fibroglandular tissue.

Background parenchymal enhancement: Mild to moderate.

Right breast: No dominant mass or suspicious enhancement.

Left breast: No dominant mass or suspicious enhancement.

Lymph nodes: No abnormal appearing lymph nodes.

Ancillary findings:  None.
IMPRESSION: No MRI evidence of malignancy in either breast.

RECOMMENDATION:
Routine annual screening with mammography and breast MRI. The
patient is due for her next screening mammogram in [DATE].

BI-RADS CATEGORY  1: Negative.

## 2019-11-06 MED ORDER — GADOBUTROL 1 MMOL/ML IV SOLN
7.0000 mL | Freq: Once | INTRAVENOUS | Status: AC | PRN
  Administered 2019-11-06: 7 mL via INTRAVENOUS

## 2020-03-27 ENCOUNTER — Other Ambulatory Visit: Payer: Self-pay | Admitting: Obstetrics and Gynecology

## 2020-03-27 DIAGNOSIS — Z1231 Encounter for screening mammogram for malignant neoplasm of breast: Secondary | ICD-10-CM

## 2020-04-30 ENCOUNTER — Other Ambulatory Visit: Payer: Self-pay

## 2020-04-30 ENCOUNTER — Ambulatory Visit
Admission: RE | Admit: 2020-04-30 | Discharge: 2020-04-30 | Disposition: A | Discharge status: Home / Self Care | Payer: 59 | Source: Ambulatory Visit | Attending: Obstetrics and Gynecology | Admitting: Obstetrics and Gynecology

## 2020-04-30 DIAGNOSIS — Z1231 Encounter for screening mammogram for malignant neoplasm of breast: Secondary | ICD-10-CM

## 2020-04-30 IMAGING — MG DIGITAL SCREENING BILAT W/ TOMO W/ CAD
8 series · 8 of 24 positions shown · non-contrast
Comparison: Previous exams.

CLINICAL DATA: Screening.

EXAM:
DIGITAL SCREENING BILATERAL MAMMOGRAM WITH TOMO AND CAD

[R CC synth-2D]
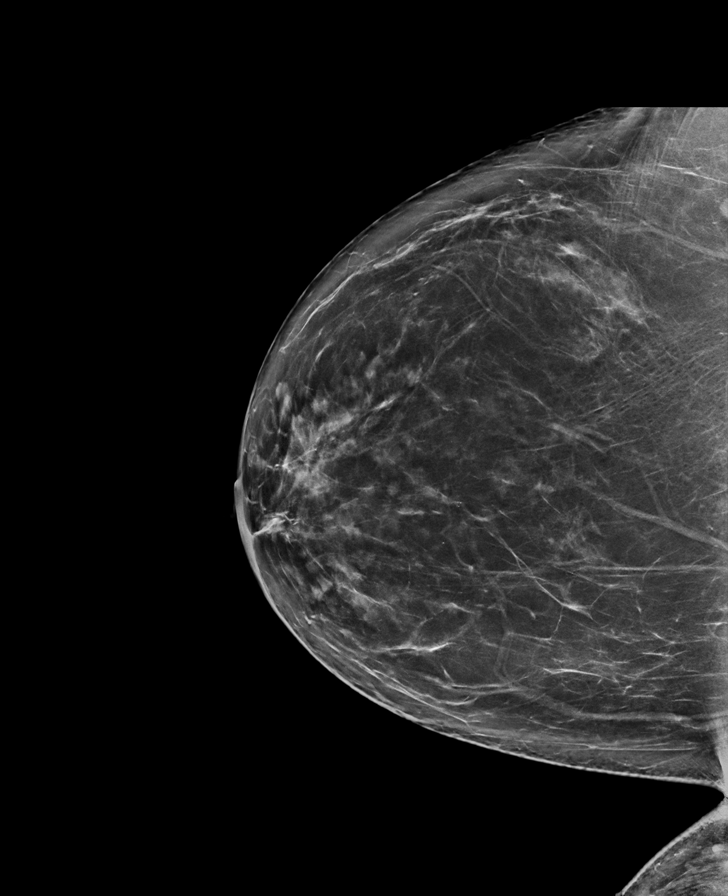

[L MLO synth-2D]
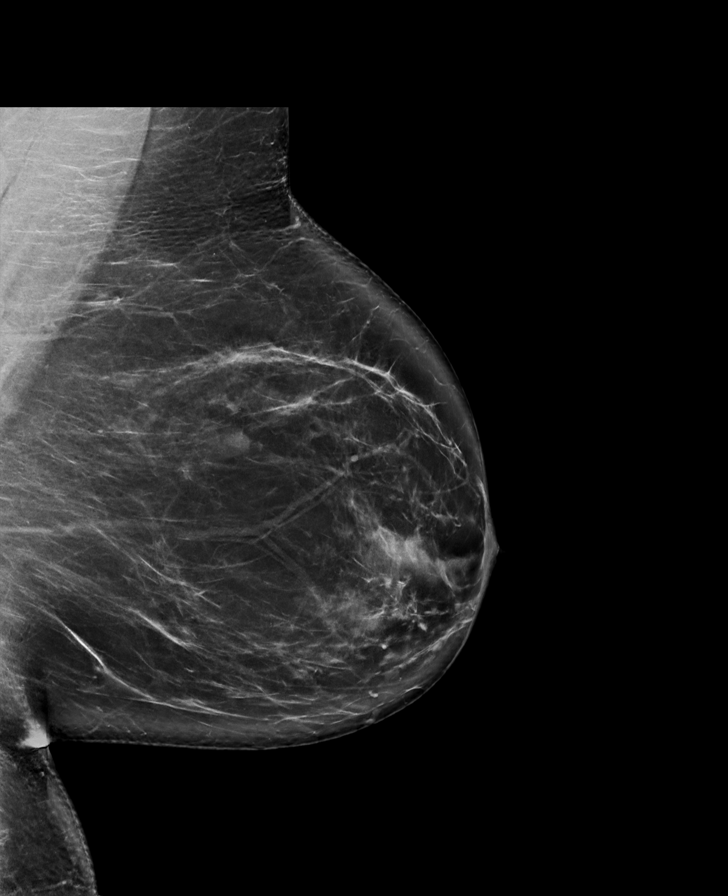

[R MLO synth-2D]
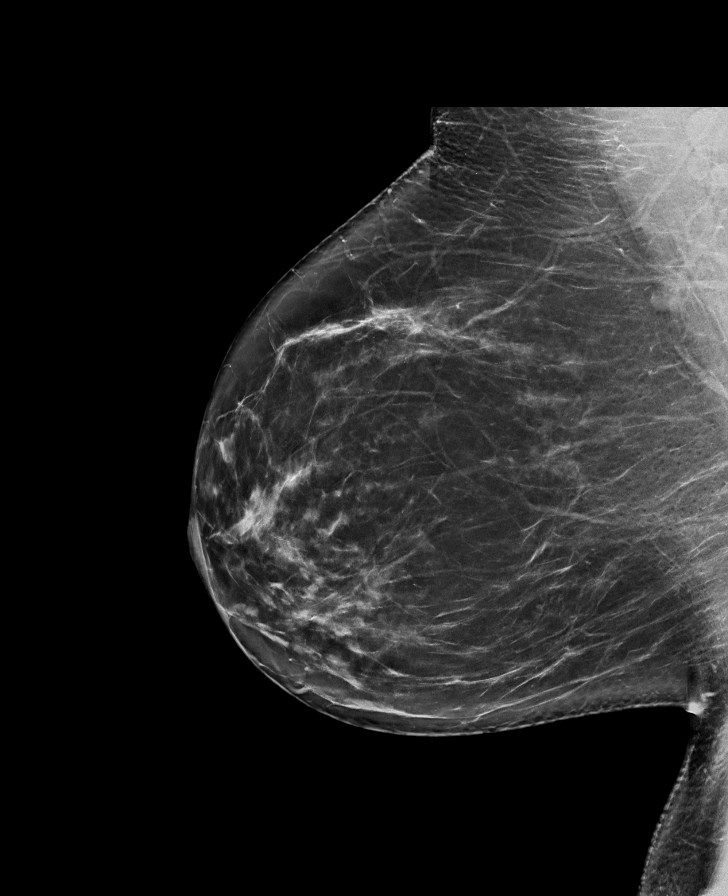

[L CC synth-2D]
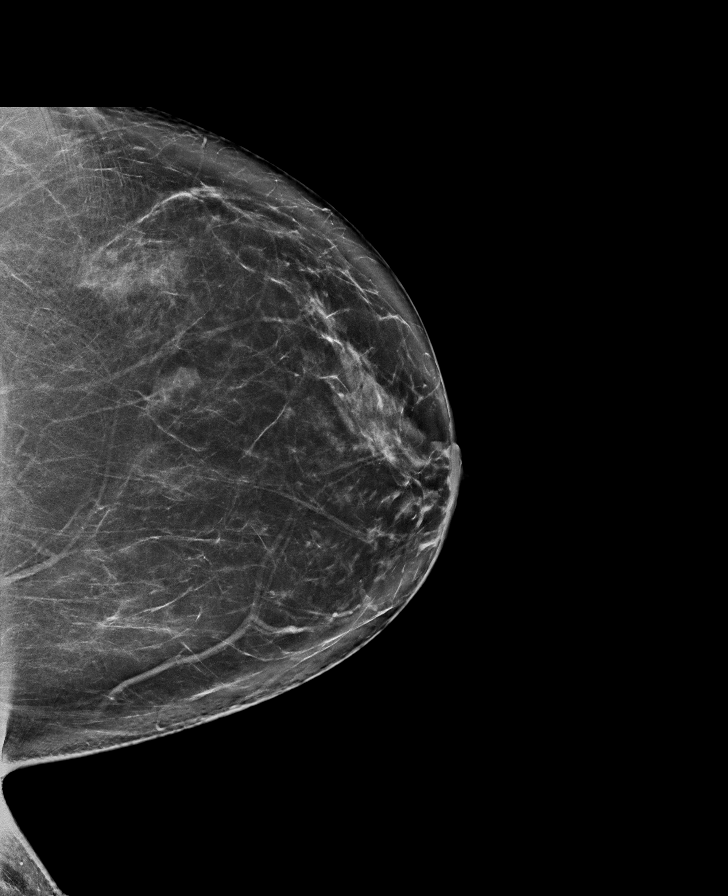

[R MLO tomo · tomo slice 47/92.0]
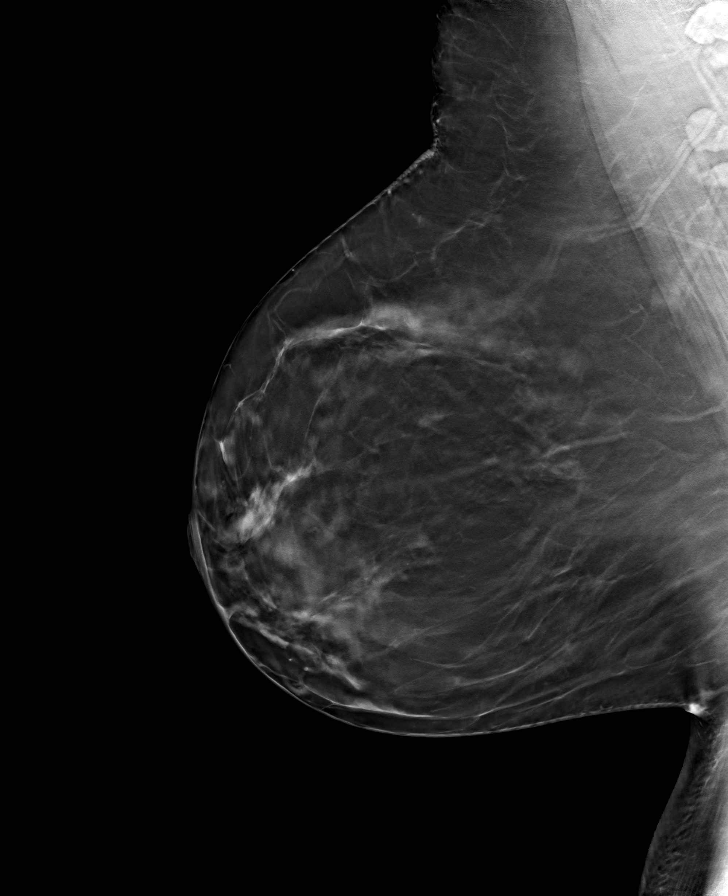

[L CC tomo · tomo slice 45/89.0]
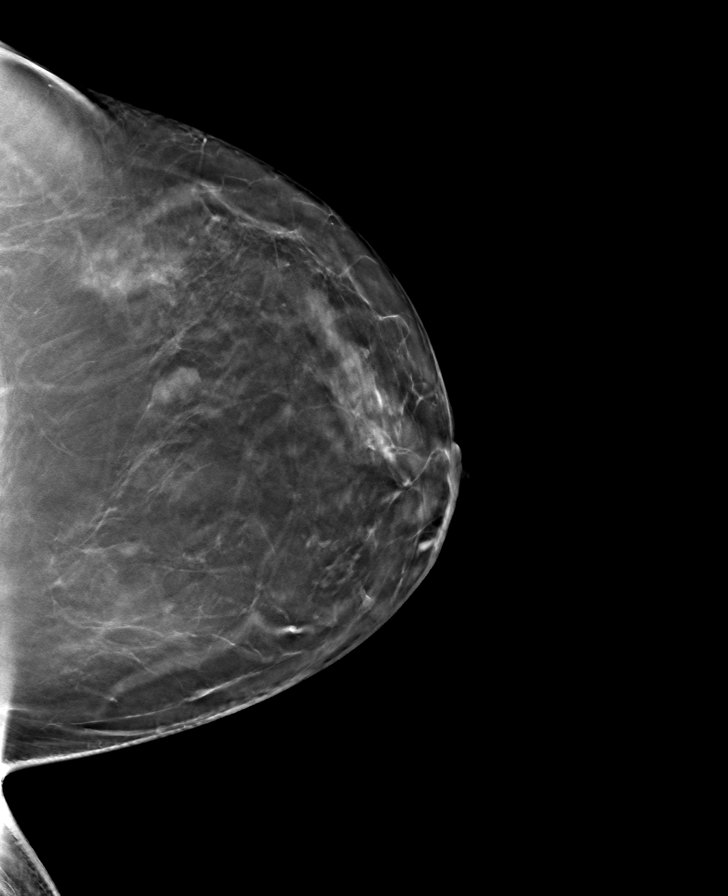

[R CC tomo · tomo slice 44/87.0]
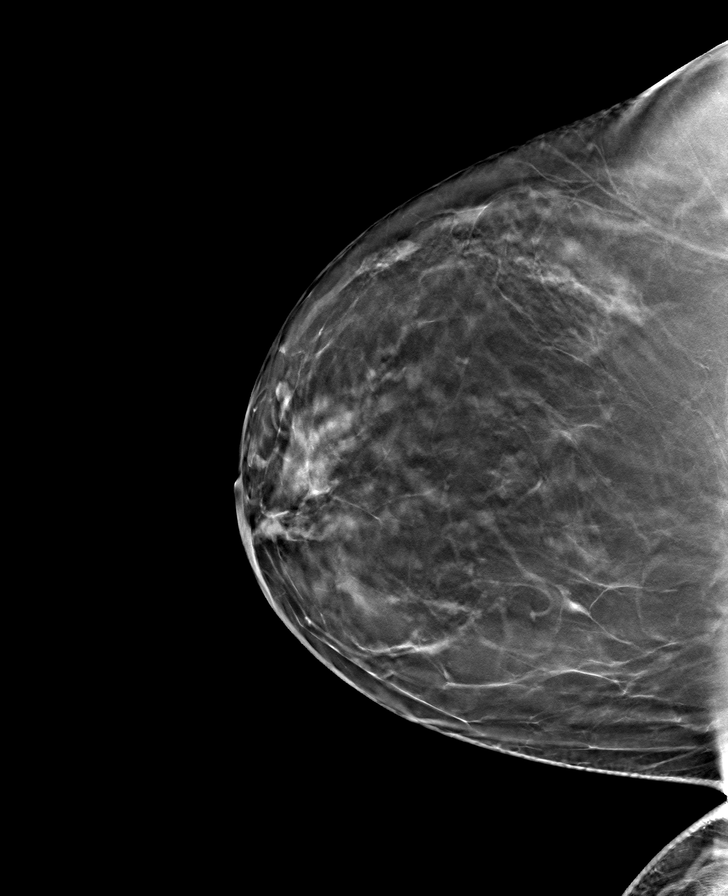

[L MLO tomo · tomo slice 47/93.0]
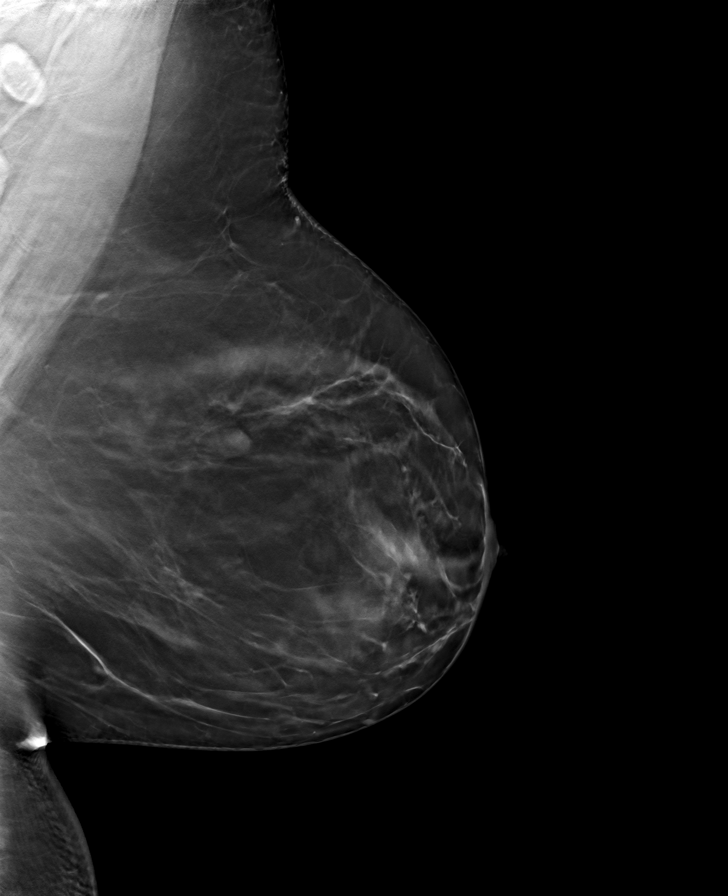

[8 of 24 positions shown; findings below may reference images not displayed]

ACR Breast Density Category b: There are scattered areas of
fibroglandular density.
FINDINGS: In the right breast, calcifications warrant further evaluation with
magnified views. In the left breast, no findings suspicious for
malignancy. Images were processed with CAD.
IMPRESSION: Further evaluation is suggested for calcifications in the right
breast.

RECOMMENDATION:
Diagnostic mammogram of the right breast. (Code:[A1])

The patient will be contacted regarding the findings, and additional
imaging will be scheduled.

BI-RADS CATEGORY  0: Incomplete. Need additional imaging evaluation
and/or prior mammograms for comparison.

## 2020-05-06 ENCOUNTER — Other Ambulatory Visit: Payer: Self-pay | Admitting: Obstetrics and Gynecology

## 2020-05-06 DIAGNOSIS — R928 Other abnormal and inconclusive findings on diagnostic imaging of breast: Secondary | ICD-10-CM

## 2020-05-15 ENCOUNTER — Other Ambulatory Visit: Payer: Self-pay | Admitting: Obstetrics and Gynecology

## 2020-05-15 ENCOUNTER — Ambulatory Visit
Admission: RE | Admit: 2020-05-15 | Discharge: 2020-05-15 | Disposition: A | Discharge status: Home / Self Care | Payer: 59 | Source: Ambulatory Visit | Attending: Obstetrics and Gynecology | Admitting: Obstetrics and Gynecology

## 2020-05-15 ENCOUNTER — Other Ambulatory Visit: Payer: Self-pay

## 2020-05-15 DIAGNOSIS — R921 Mammographic calcification found on diagnostic imaging of breast: Secondary | ICD-10-CM

## 2020-05-15 DIAGNOSIS — R928 Other abnormal and inconclusive findings on diagnostic imaging of breast: Secondary | ICD-10-CM

## 2020-05-15 IMAGING — MG DIGITAL DIAGNOSTIC UNILAT RIGHT W/ CAD
3 series · 3 of 3 positions shown · non-contrast
Comparison: Previous exam(s).

CLINICAL DATA: 53-year-old female recalled from screening mammogram
dated [DATE] for right breast calcifications.

EXAM:
DIGITAL DIAGNOSTIC RIGHT MAMMOGRAM WITH CAD

[R ML (1 of 2)]
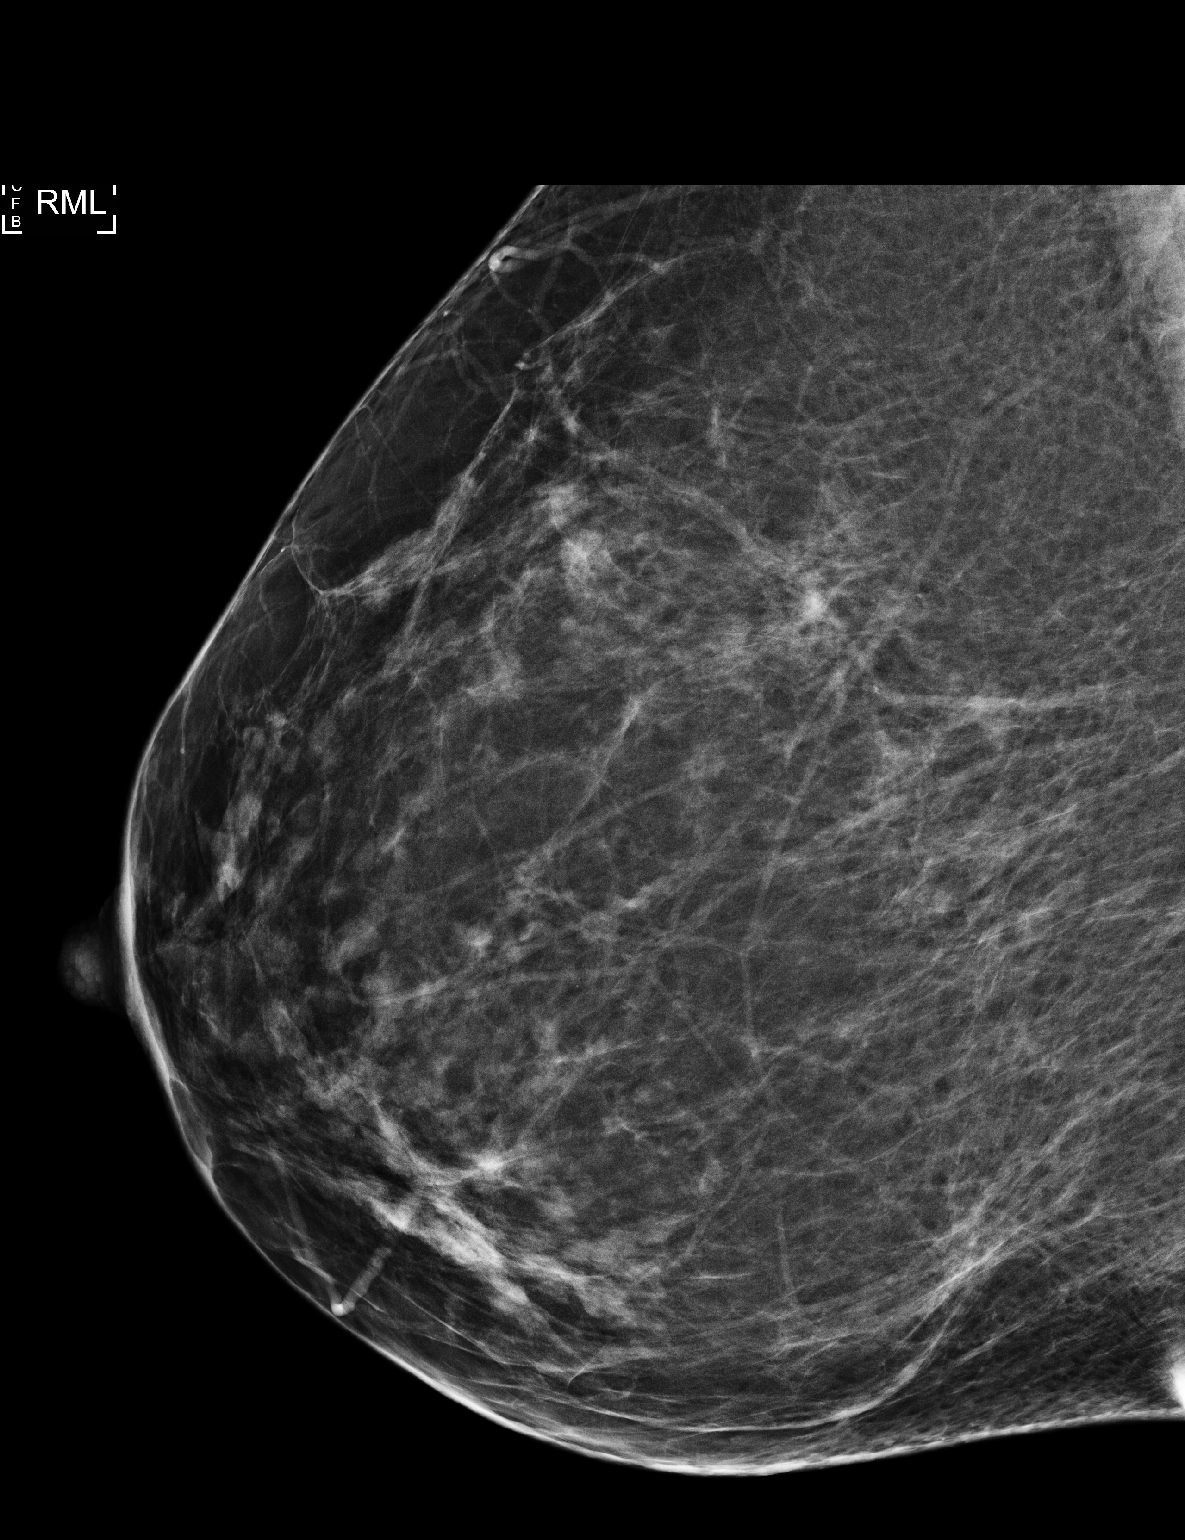

[R ML (2 of 2)]
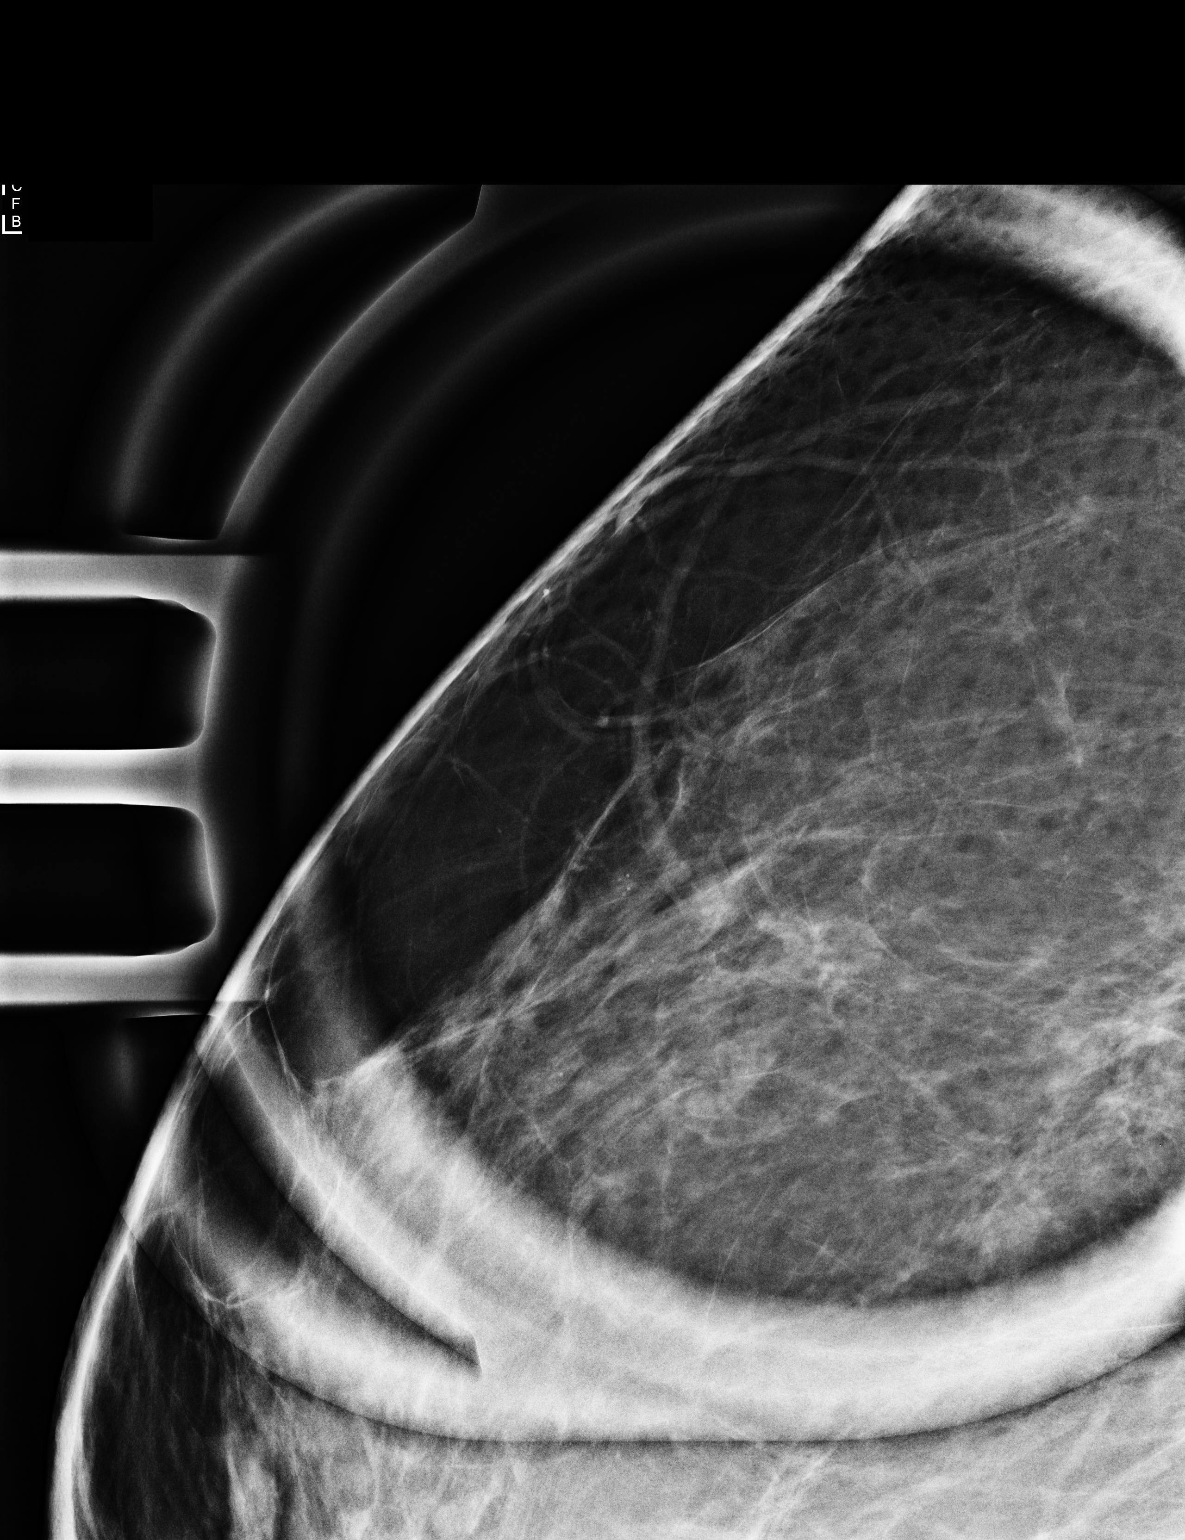

[R CC]
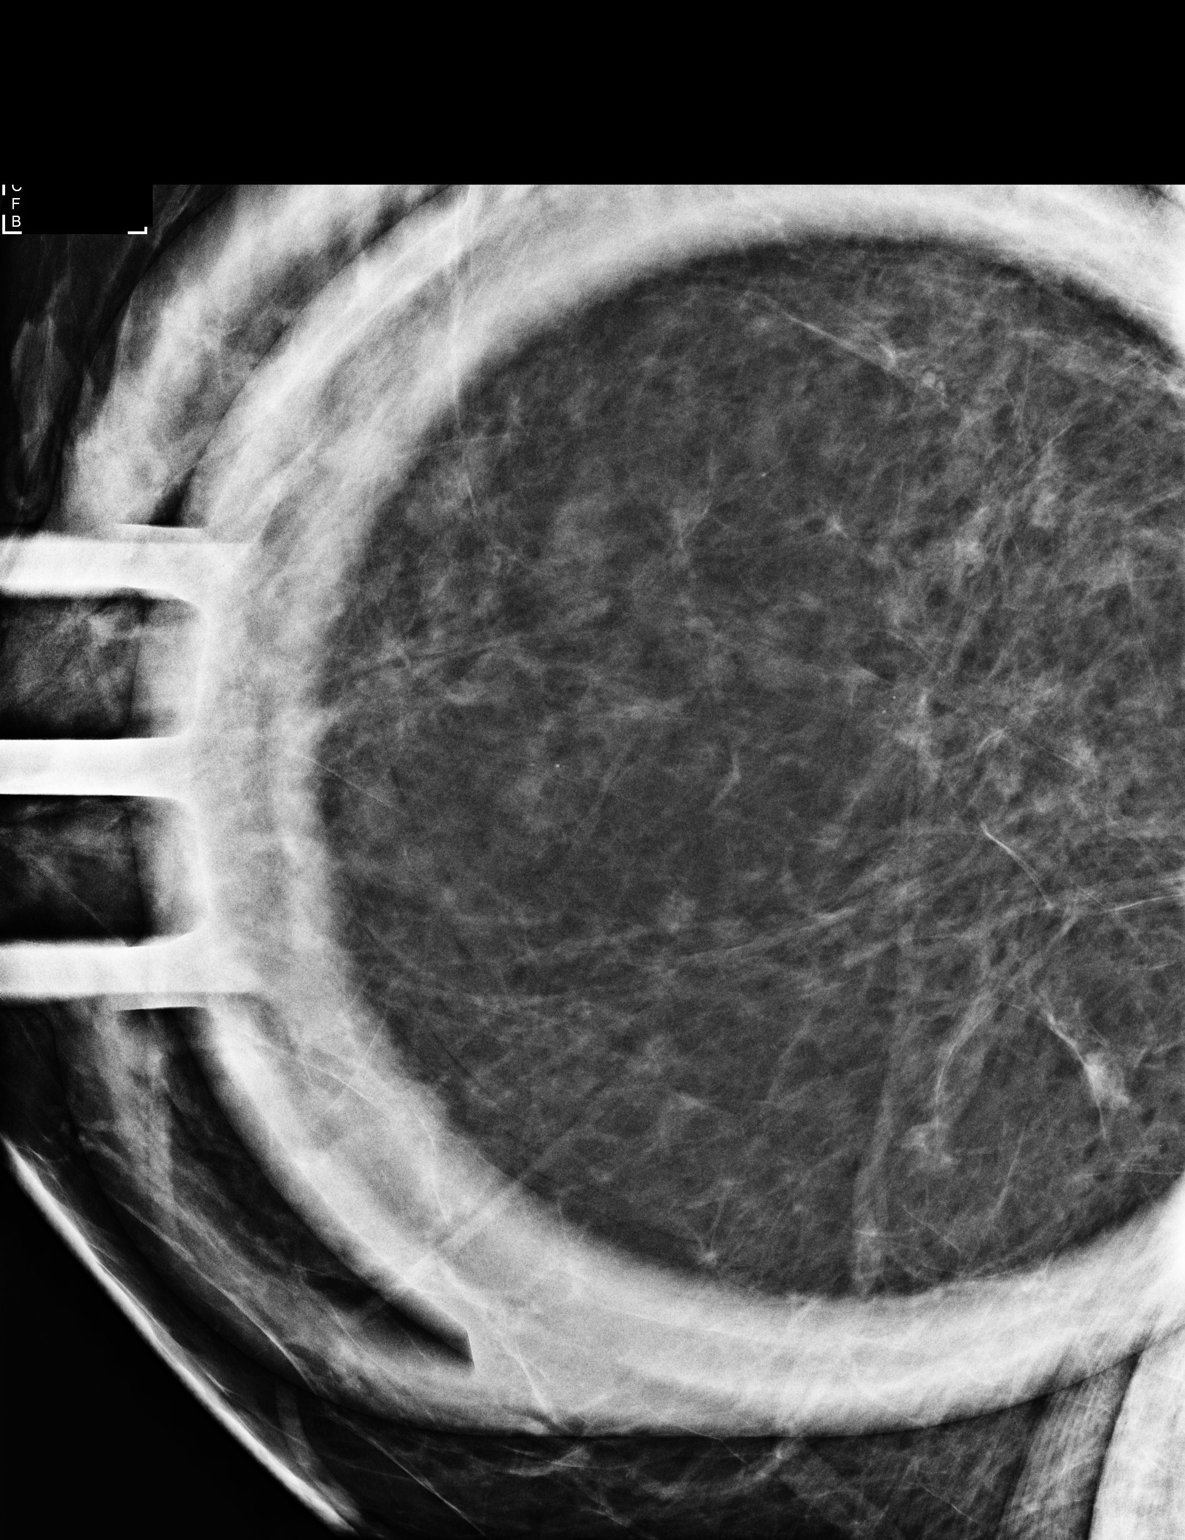

[3 of 3 positions shown; findings below may reference images not displayed]

ACR Breast Density Category b: There are scattered areas of
fibroglandular density.
FINDINGS: There are loosely grouped punctate calcifications in the superior
central right breast.

Mammographic images were processed with CAD.
IMPRESSION: Probably benign right breast calcifications. Recommendation is for
six-month mammographic follow-up.

RECOMMENDATION:
Diagnostic right breast mammogram in 6 months.

I have discussed the findings and recommendations with the patient.
If applicable, a reminder letter will be sent to the patient
regarding the next appointment.

BI-RADS CATEGORY  3: Probably benign.

## 2020-10-22 ENCOUNTER — Other Ambulatory Visit: Payer: Self-pay | Admitting: Obstetrics and Gynecology

## 2020-10-22 DIAGNOSIS — Z9189 Other specified personal risk factors, not elsewhere classified: Secondary | ICD-10-CM

## 2020-11-10 ENCOUNTER — Ambulatory Visit
Admission: RE | Admit: 2020-11-10 | Discharge: 2020-11-10 | Disposition: A | Discharge status: Home / Self Care | Payer: 59 | Source: Ambulatory Visit | Attending: Obstetrics and Gynecology | Admitting: Obstetrics and Gynecology

## 2020-11-10 DIAGNOSIS — Z9189 Other specified personal risk factors, not elsewhere classified: Secondary | ICD-10-CM

## 2020-11-10 IMAGING — MR MR BREAST BILAT WO/W CM
8 of 12 series · 33 of 48 positions shown · IV contrast (8ml Gadavist)
Comparison: [DATE] breast MR. Prior mammograms and ultrasounds

CLINICAL DATA: 54-year-old female with high lifetime risk for
developing breast cancer - for screening breast MRI.

LABS:  None performed today
EXAM:
BILATERAL BREAST MRI WITH AND WITHOUT CONTRAST
TECHNIQUE: Multiplanar, multisequence MR images of both breasts were obtained
prior to and following the intravenous administration of 10 ml of
Gadavist

[Series 3: t2_tirm_tra ipat (a-p) · axial · 3.0mm · 0.78mm/px · 1 of 62 slices shown]
[im 1/62]
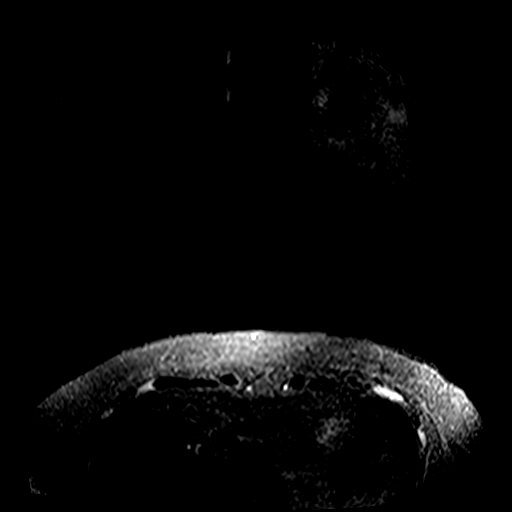

[Series 4: fl3d pre-cm no · axial · non-contrast · 1.2mm · 1.04mm/px · z∈[-74,+117]mm · 5 of 160 slices shown]
[im 1/160]
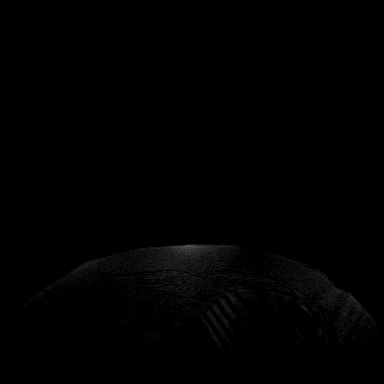
[im 40/160]
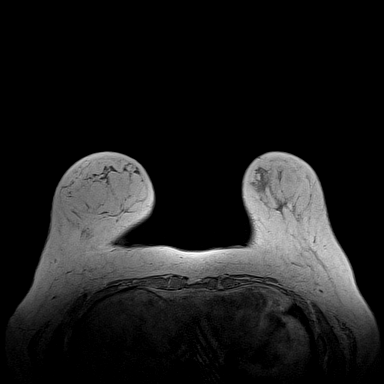
[im 80/160]
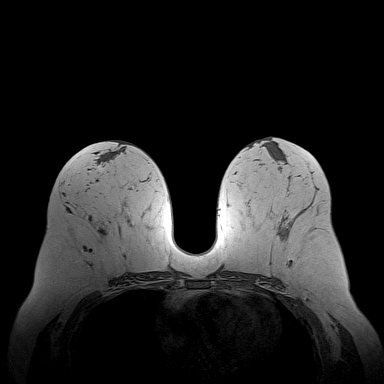
[im 120/160]
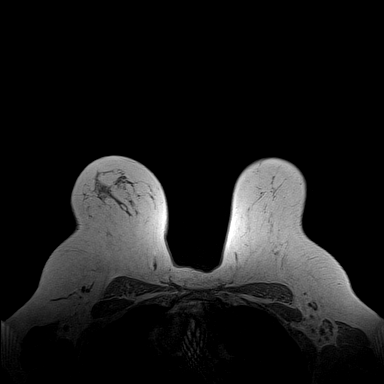
[im 160/160]
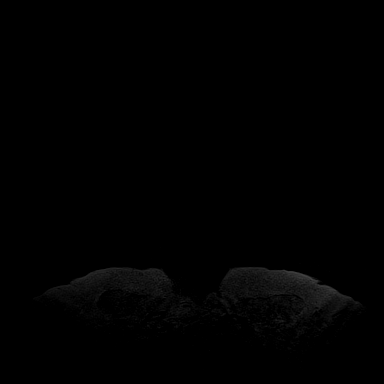

[Series 5: fl3d pre-cm · axial · non-contrast · 1.2mm · 0.94mm/px · z∈[-74,+117]mm · 5 of 160 slices shown]
[im 1/160]
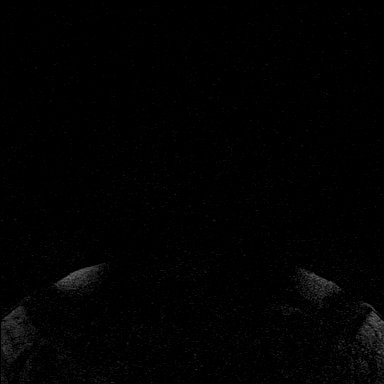
[im 40/160]
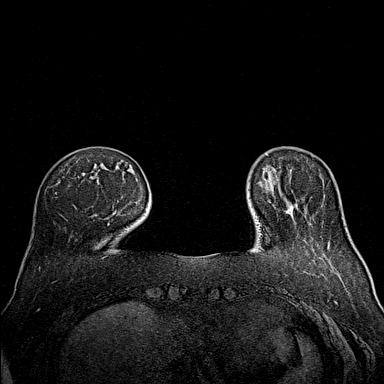
[im 80/160]
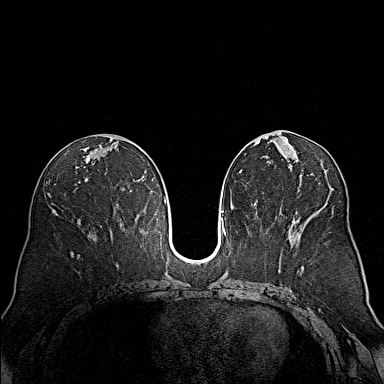
[im 120/160]
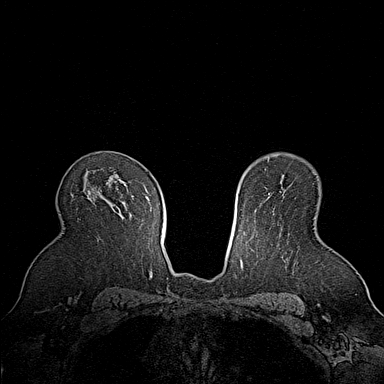
[im 160/160]
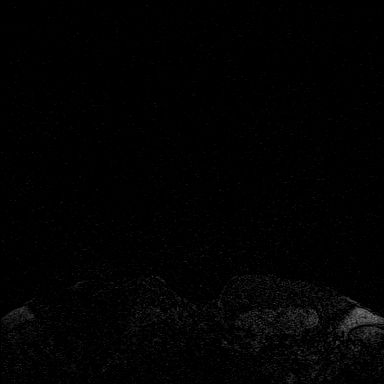

[Series 6: fl3d post-cm 20 · axial · 1.2mm · 0.94mm/px · z∈[-74,+117]mm · 5 of 160 slices shown (1 of 3)]
[im 1/160]
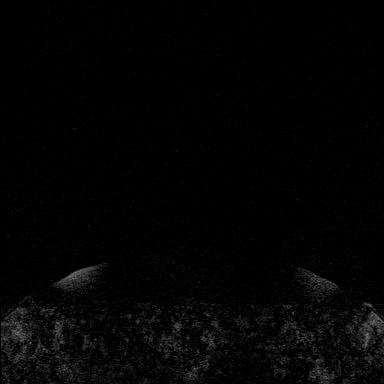
[im 40/160]
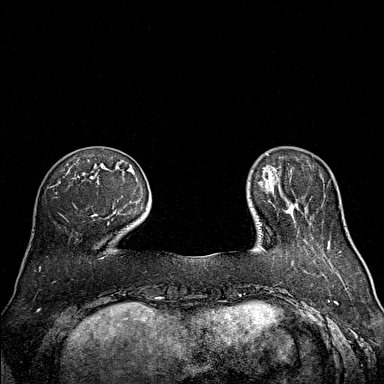
[im 80/160]
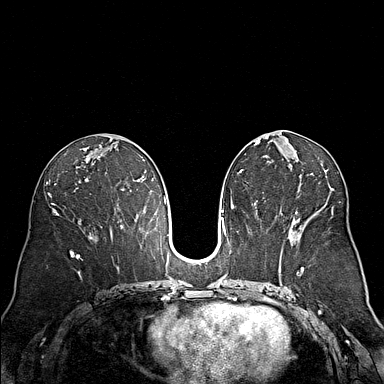
[im 120/160]
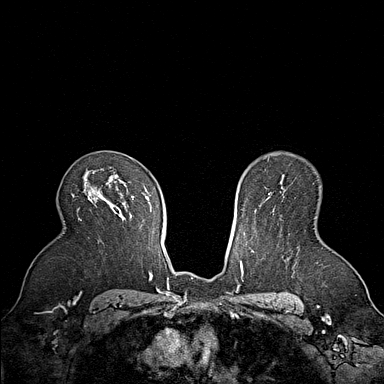
[im 160/160]
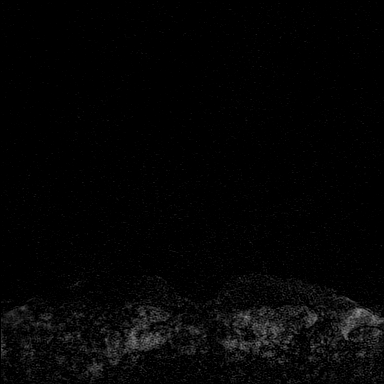

[Series 7: fl3d post-cm 20 · axial · 1.2mm · 0.94mm/px · z∈[-74,+117]mm · 5 of 160 slices shown (2 of 3)]
[im 1/160]
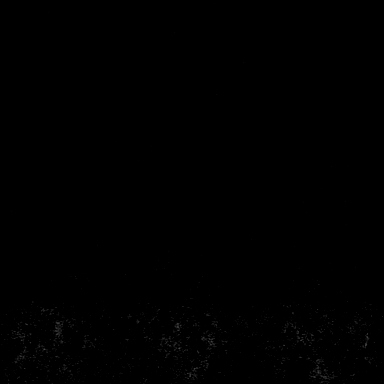
[im 40/160]
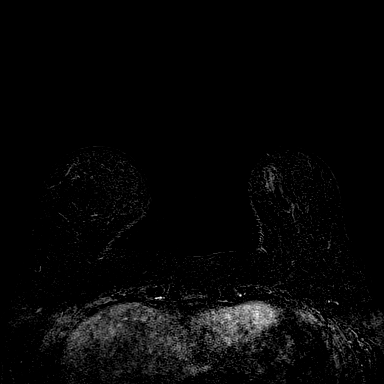
[im 80/160]
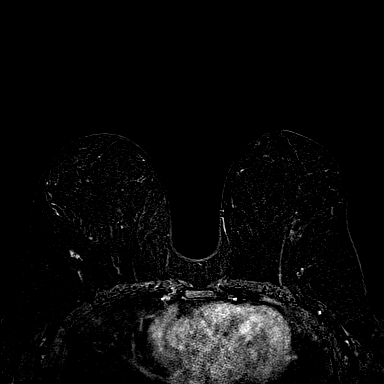
[im 120/160]
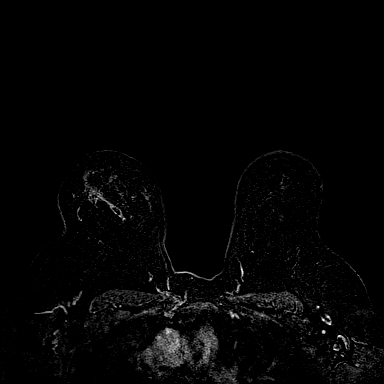
[im 160/160]
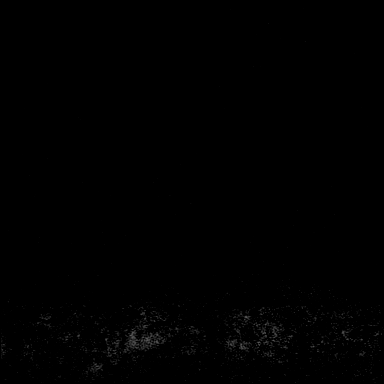

[Series 8: fl3d post-cm 20 · axial · 192.0mm · 0.94mm/px · 1 of 1 slices shown (3 of 3)]
[im 1/1]
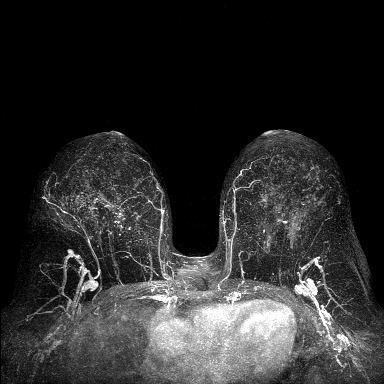

[Series 9: fl3d post-cm 3min · axial · 1.2mm · 0.94mm/px · z∈[-74,+117]mm · 6 of 160 slices shown]
[im 1/160]
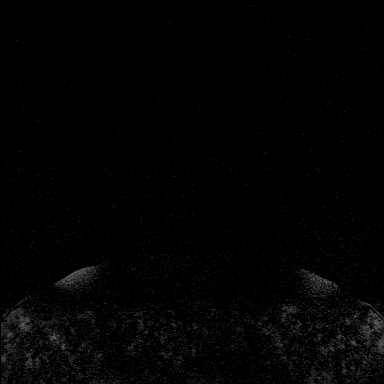
[im 32/160]
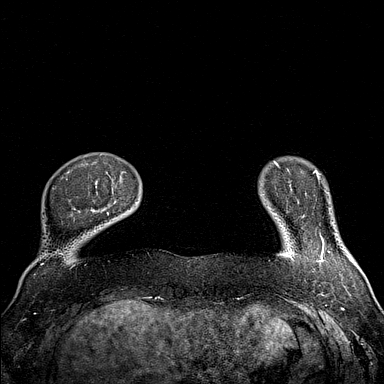
[im 64/160]
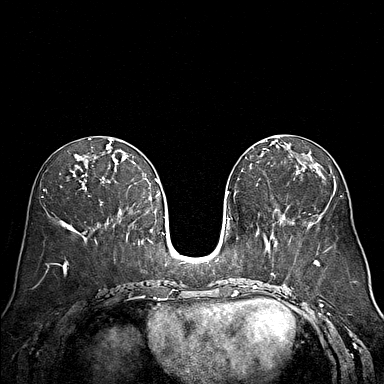
[im 96/160]
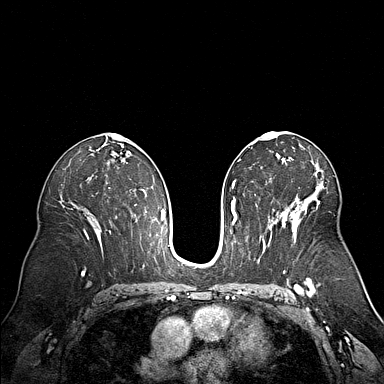
[im 128/160]
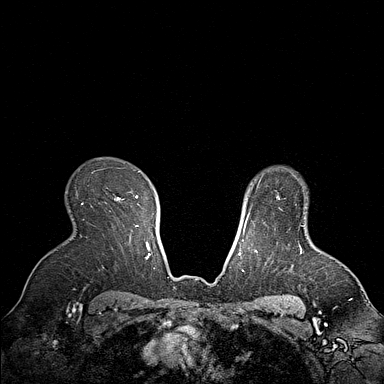
[im 160/160]
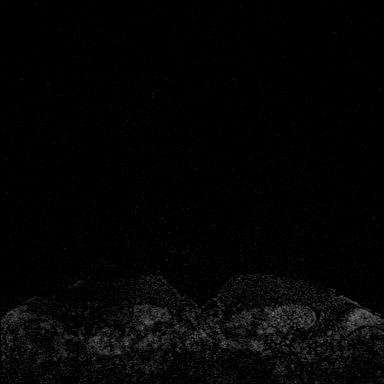

[Series 10: fl3d post-cm 3min_sub · axial · 1.2mm · 0.94mm/px · z∈[-74,+79]mm · 5 of 160 slices shown]
[im 1/160]
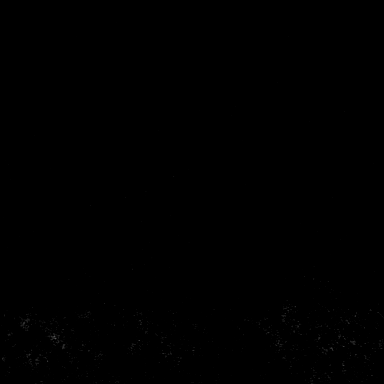
[im 32/160]
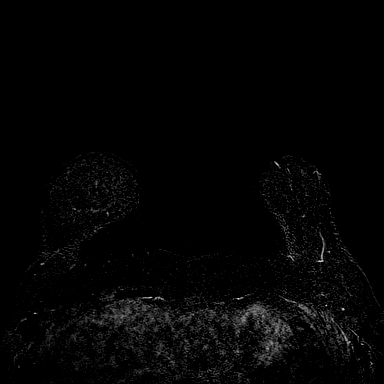
[im 64/160]
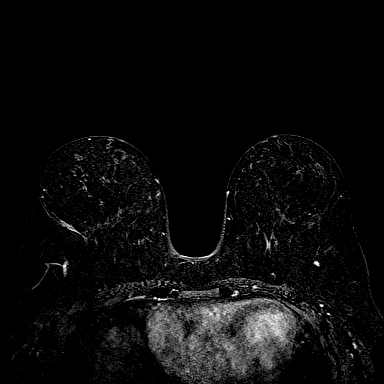
[im 96/160]
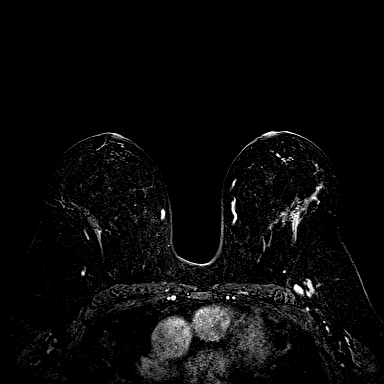
[im 128/160]
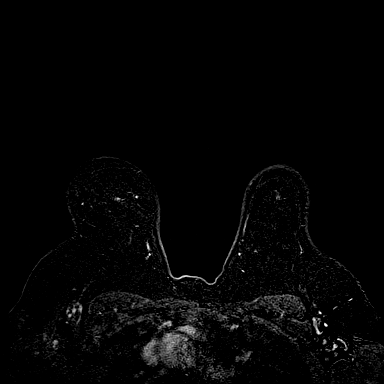

[33 of 48 positions shown; findings below may reference images not displayed]

Three-dimensional MR images were rendered by post-processing of the
original MR data on an independent workstation. The
three-dimensional MR images were interpreted, and findings are
reported in the following complete MRI report for this study. Three
dimensional images were evaluated at the independent interpreting
workstation using the [employer] thin client.
FINDINGS: Breast composition: b. Scattered fibroglandular tissue.

Background parenchymal enhancement: Moderate.

Right breast: No mass or abnormal enhancement.

Left breast: No mass or abnormal enhancement.

Lymph nodes: No abnormal appearing lymph nodes.

Ancillary findings:  None.
IMPRESSION: 1. No MRI evidence of breast malignancy.

RECOMMENDATION:
1. Bilateral screening mammogram in 6 months to resume annual
mammogram schedule.
2. Bilateral screening breast MRI in 1 year in this high risk
patient.

BI-RADS CATEGORY  1: Negative.

## 2020-11-10 MED ORDER — GADOBUTROL 1 MMOL/ML IV SOLN
10.0000 mL | Freq: Once | INTRAVENOUS | Status: AC | PRN
  Administered 2020-11-10: 10 mL via INTRAVENOUS

## 2020-12-29 ENCOUNTER — Ambulatory Visit
Admission: RE | Admit: 2020-12-29 | Discharge: 2020-12-29 | Disposition: A | Discharge status: Home / Self Care | Payer: 59 | Source: Ambulatory Visit | Attending: Obstetrics and Gynecology | Admitting: Obstetrics and Gynecology

## 2020-12-29 ENCOUNTER — Other Ambulatory Visit: Payer: Self-pay

## 2020-12-29 ENCOUNTER — Other Ambulatory Visit: Payer: Self-pay | Admitting: Obstetrics and Gynecology

## 2020-12-29 DIAGNOSIS — R921 Mammographic calcification found on diagnostic imaging of breast: Secondary | ICD-10-CM

## 2020-12-29 IMAGING — MG MM DIGITAL DIAGNOSTIC UNILAT*R* W/ TOMO W/ CAD
6 series · 6 of 14 positions shown · non-contrast
Comparison: Previous exam(s).

CLINICAL DATA: Patient for short-term follow-up probably benign
right breast calcifications.

EXAM:
DIGITAL DIAGNOSTIC UNILATERAL RIGHT MAMMOGRAM WITH TOMOSYNTHESIS AND
CAD
TECHNIQUE: Right digital diagnostic mammography and breast tomosynthesis was
performed. The images were evaluated with computer-aided detection.

[R ML]
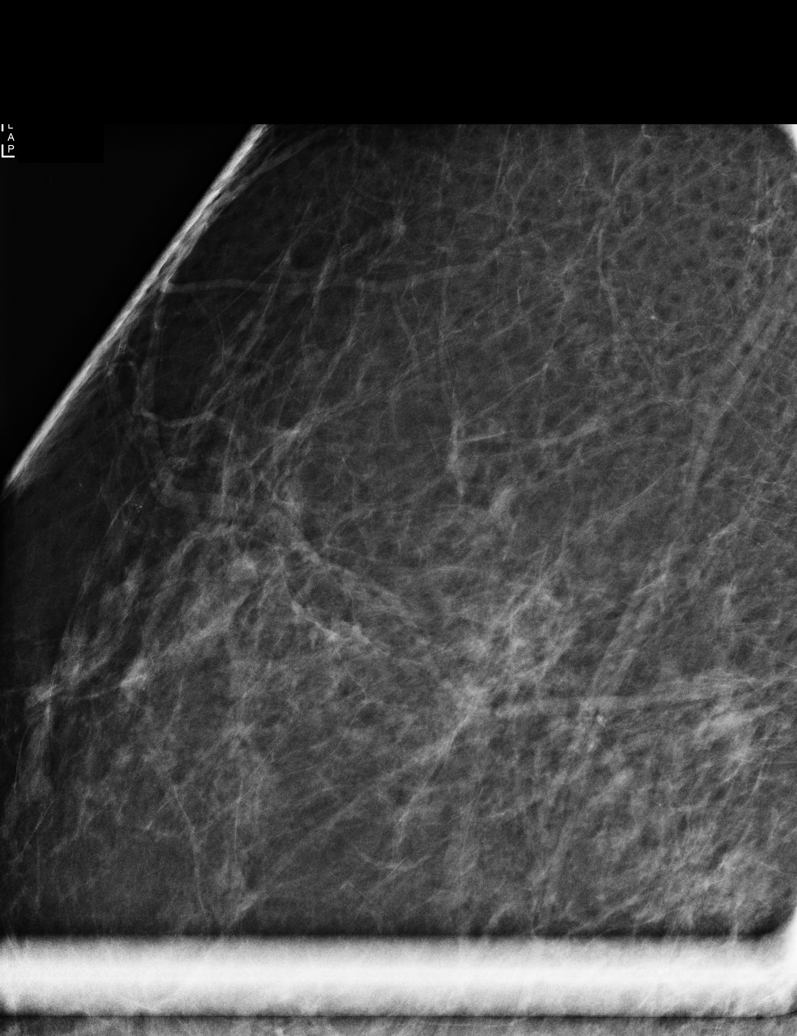

[R CC]
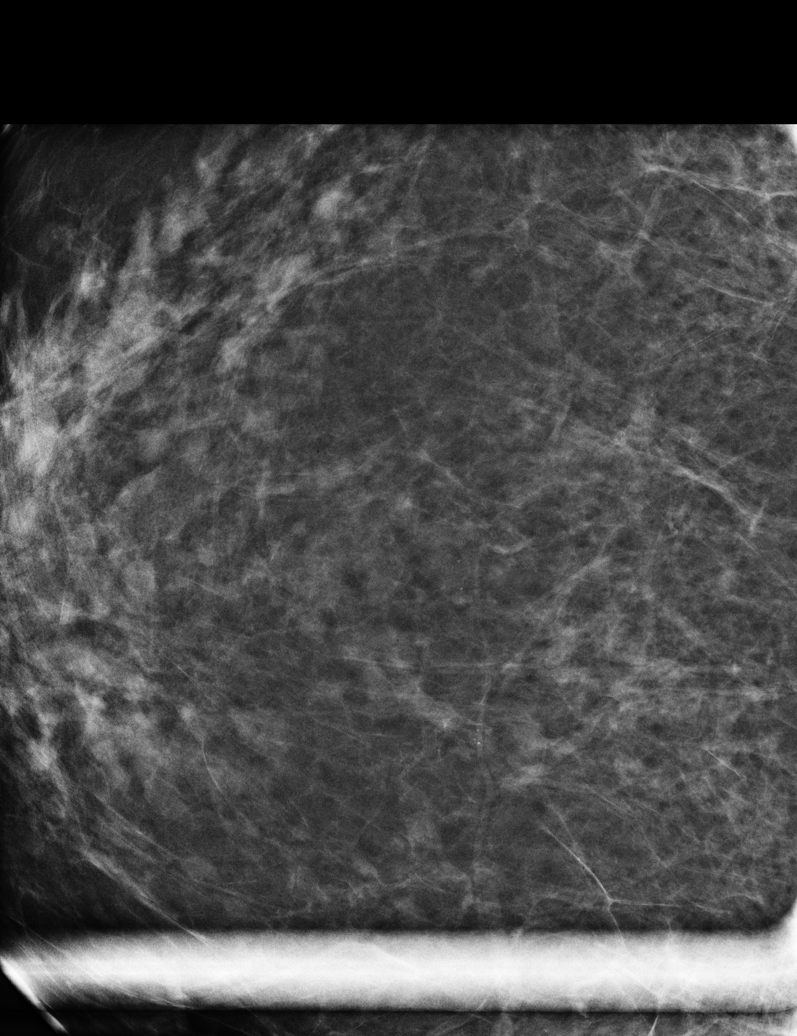

[R CC synth-2D]
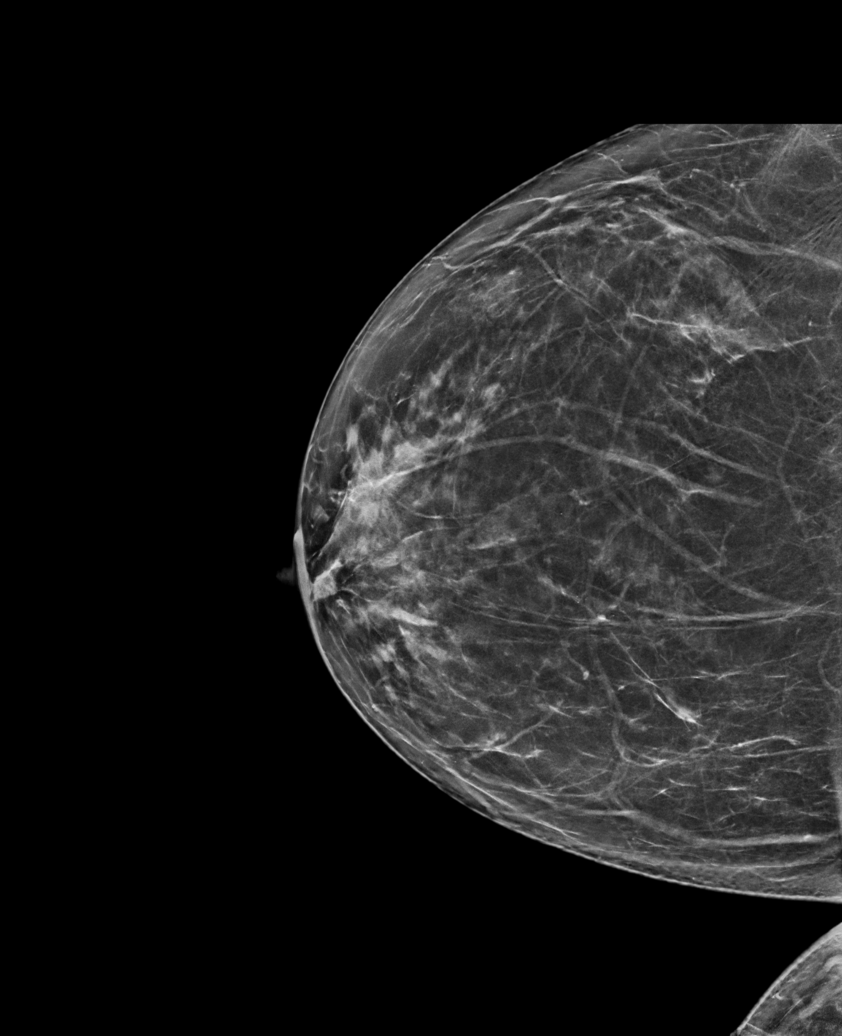

[R MLO synth-2D]
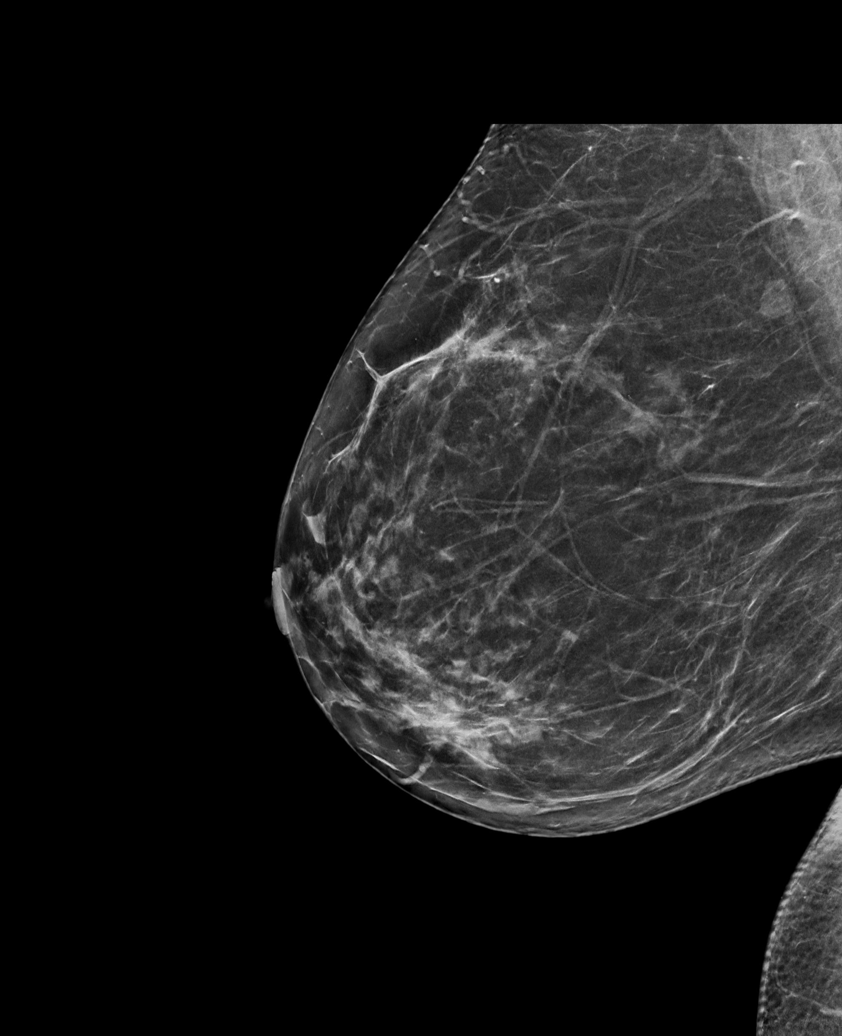

[R CC tomo · tomo slice 37/73.0]
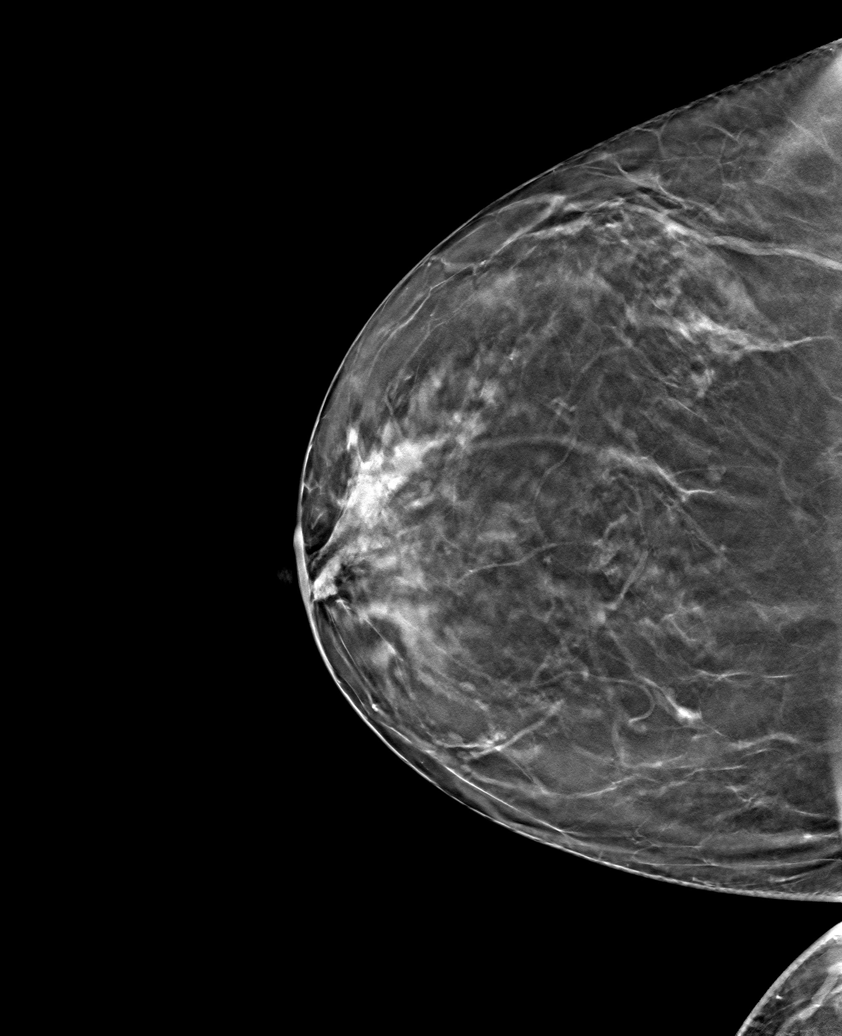

[R MLO tomo · tomo slice 39/76.0]
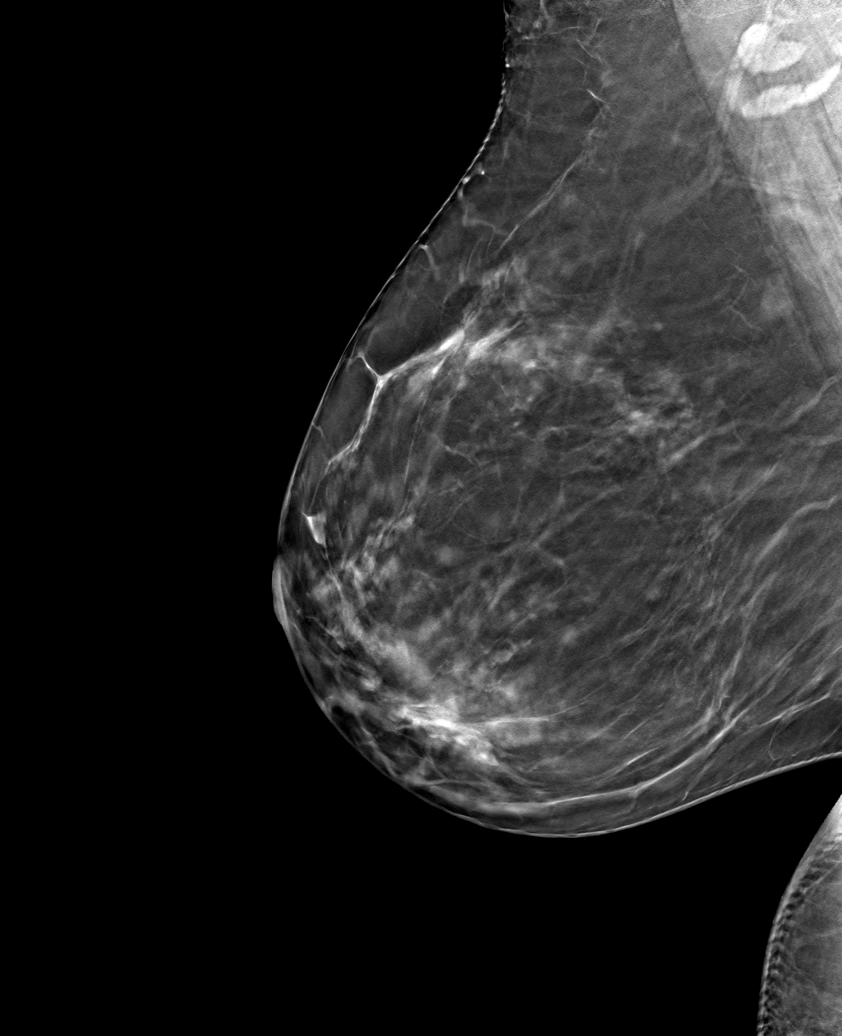

[6 of 14 positions shown; findings below may reference images not displayed]

ACR Breast Density Category b: There are scattered areas of
fibroglandular density.
FINDINGS: Magnification cc and true lateral views of the right breast were
obtained. Grossly unchanged loosely grouped punctate calcifications
within the upper-outer right breast. No new masses, calcifications
or nonsurgical distortion identified.
IMPRESSION: Stable probably benign right breast calcifications.

RECOMMENDATION:
Bilateral diagnostic mammography with right breast magnification
views [DATE].

I have discussed the findings and recommendations with the patient.
If applicable, a reminder letter will be sent to the patient
regarding the next appointment.

BI-RADS CATEGORY  3: Probably benign.

## 2021-04-29 ENCOUNTER — Ambulatory Visit
Admission: RE | Admit: 2021-04-29 | Discharge: 2021-04-29 | Disposition: A | Discharge status: Home / Self Care | Payer: PRIVATE HEALTH INSURANCE | Source: Ambulatory Visit | Attending: Obstetrics and Gynecology | Admitting: Obstetrics and Gynecology

## 2021-04-29 DIAGNOSIS — R921 Mammographic calcification found on diagnostic imaging of breast: Secondary | ICD-10-CM

## 2021-04-29 IMAGING — MG DIGITAL DIAGNOSTIC BILAT W/ TOMO W/ CAD
6 of 10 series · 6 of 26 positions shown · non-contrast
Comparison: Previous exam(s).

CLINICAL DATA: One year follow-up of probably benign right breast
calcifications.

EXAM:
DIGITAL DIAGNOSTIC BILATERAL MAMMOGRAM WITH TOMOSYNTHESIS AND CAD
TECHNIQUE: Bilateral digital diagnostic mammography and breast tomosynthesis
was performed. The images were evaluated with computer-aided
detection.

[R ML]
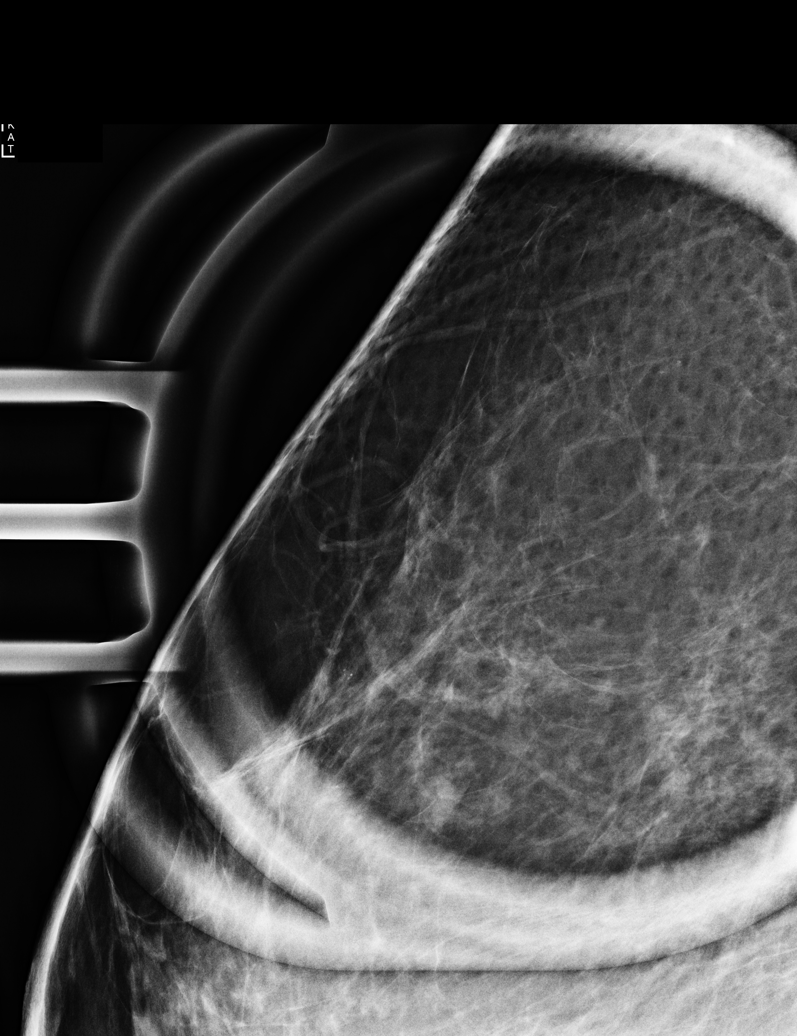

[R CC]
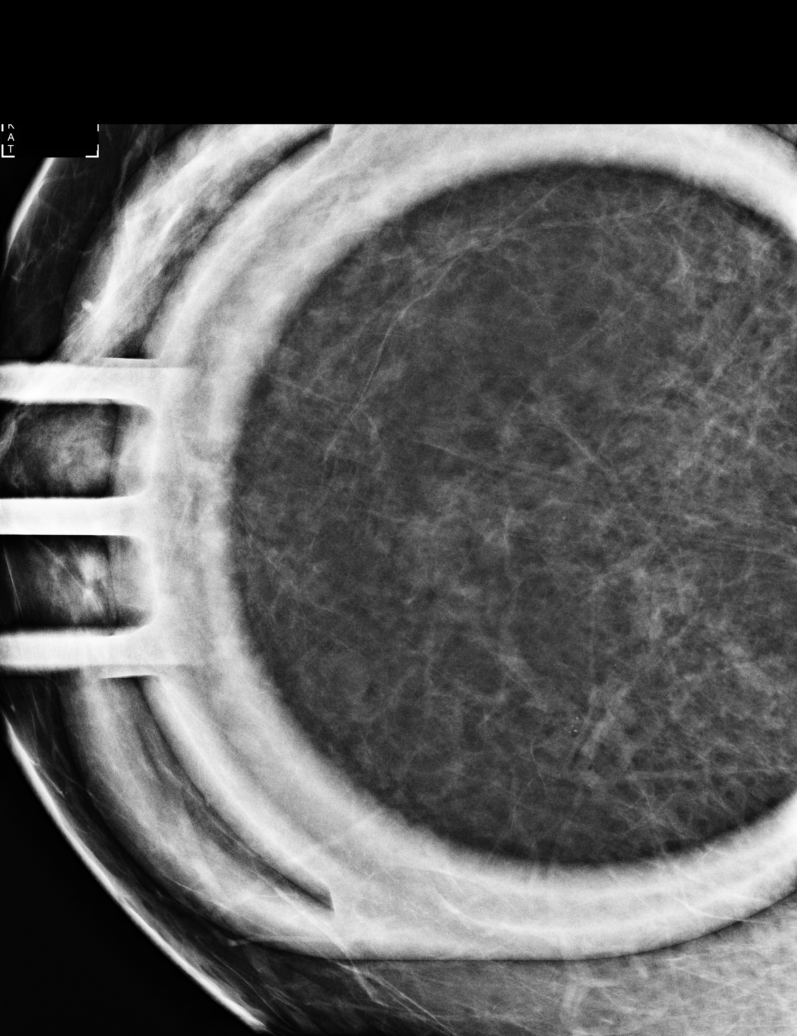

[L CC synth-2D]
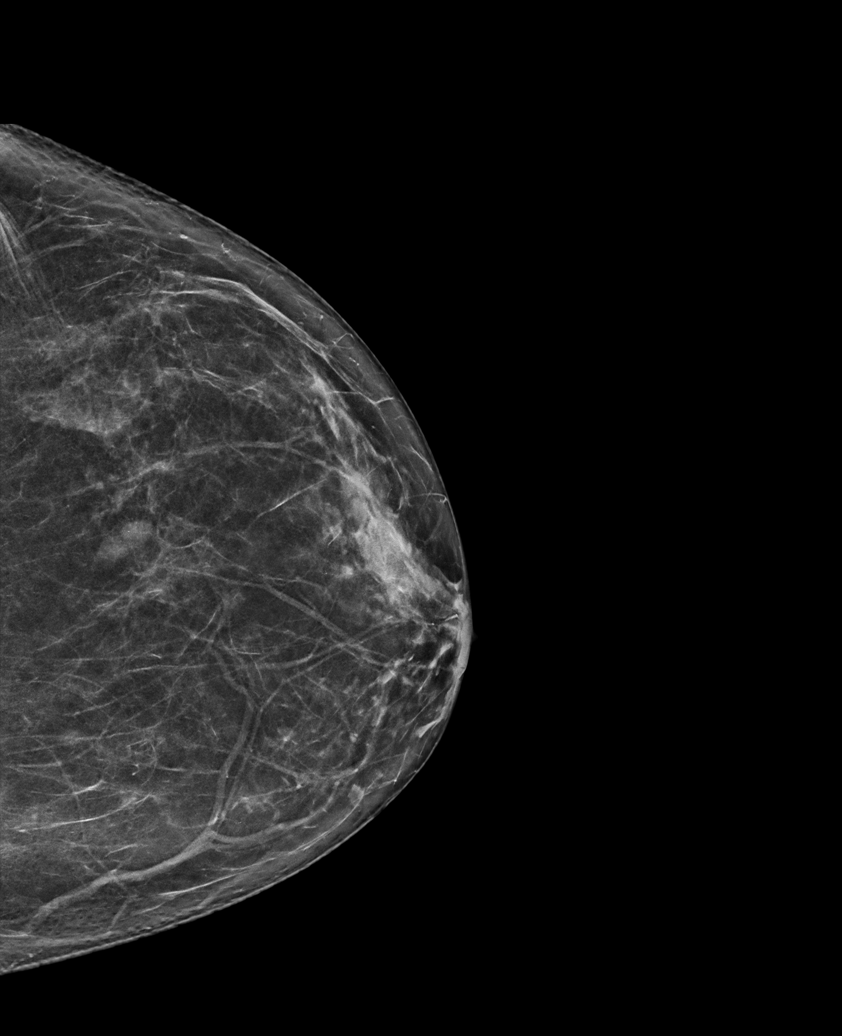

[R CC synth-2D]
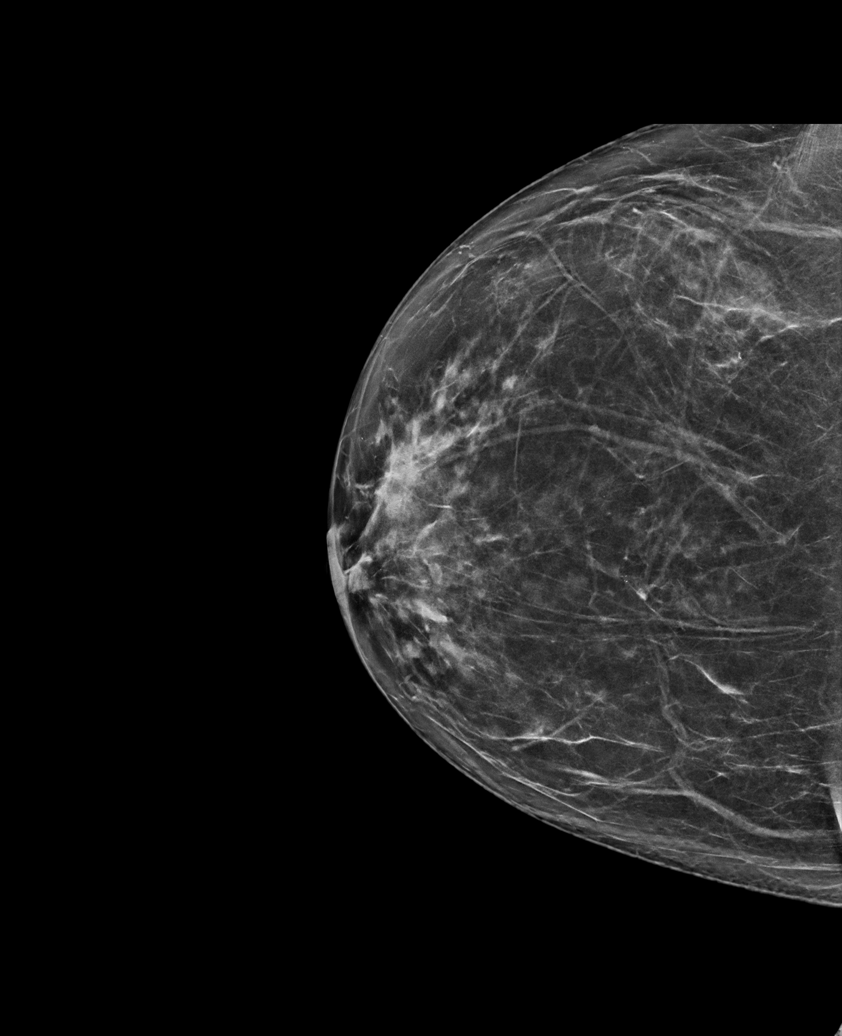

[R MLO synth-2D]
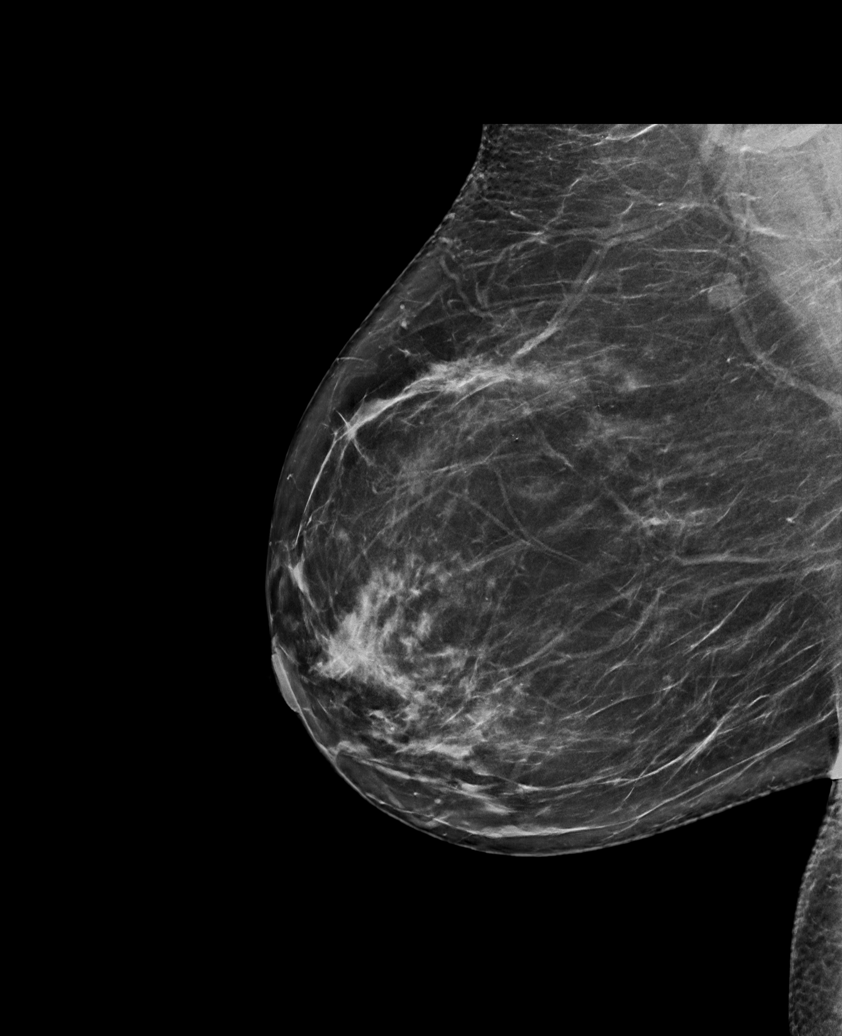

[L MLO synth-2D]
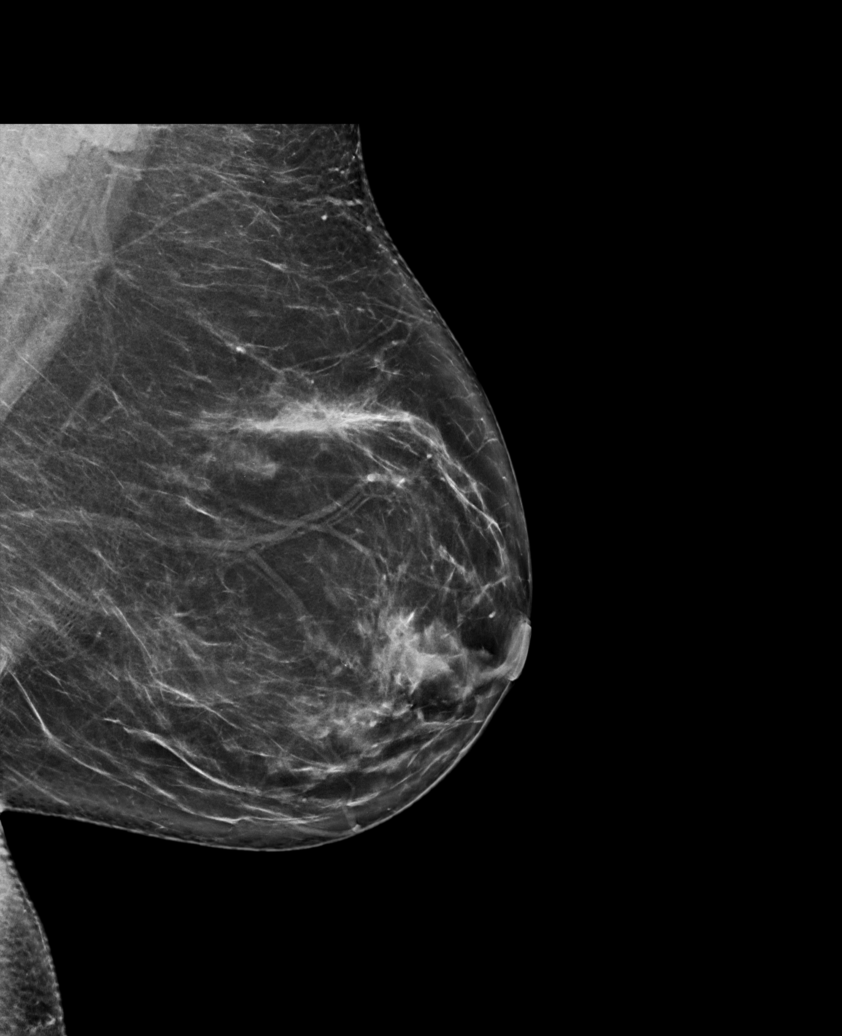

[6 of 26 positions shown; findings below may reference images not displayed]

ACR Breast Density Category b: There are scattered areas of
fibroglandular density.
FINDINGS: The tiny group of round calcifications in the superior right breast
are stable. No other suspicious findings in either breast.
IMPRESSION: Stable probably benign superior right breast calcifications. No
other evidence of malignancy.

RECOMMENDATION:
Twelve month follow-up of the probably benign right breast
calcifications. The patient will be due for bilateral mammography at
that time.

I have discussed the findings and recommendations with the patient.
If applicable, a reminder letter will be sent to the patient
regarding the next appointment.

BI-RADS CATEGORY  3: Probably benign.

## 2021-08-05 ENCOUNTER — Other Ambulatory Visit: Payer: Self-pay | Admitting: Obstetrics and Gynecology

## 2021-08-05 DIAGNOSIS — Z9189 Other specified personal risk factors, not elsewhere classified: Secondary | ICD-10-CM

## 2021-10-15 ENCOUNTER — Other Ambulatory Visit: Payer: PRIVATE HEALTH INSURANCE

## 2021-11-12 ENCOUNTER — Inpatient Hospital Stay: Admission: RE | Admit: 2021-11-12 | Payer: PRIVATE HEALTH INSURANCE | Source: Ambulatory Visit

## 2021-11-19 ENCOUNTER — Ambulatory Visit
Admission: RE | Admit: 2021-11-19 | Discharge: 2021-11-19 | Disposition: A | Discharge status: Home / Self Care | Payer: 59 | Source: Ambulatory Visit | Attending: Obstetrics and Gynecology | Admitting: Obstetrics and Gynecology

## 2021-11-19 DIAGNOSIS — Z9189 Other specified personal risk factors, not elsewhere classified: Secondary | ICD-10-CM

## 2021-11-19 IMAGING — MR MR BREAST BILAT WO/W CM
8 of 12 series · 33 of 48 positions shown · IV contrast (8 ML GADAVIST)
Comparison: Previous exam(s).

CLINICAL DATA: Patient with elevated lifetime risk of breast
cancer. High risk screening MRI.

EXAM:
BILATERAL BREAST MRI WITH AND WITHOUT CONTRAST
TECHNIQUE: Multiplanar, multisequence MR images of both breasts were obtained
prior to and following the intravenous administration of 8 ml of
Gadavist

[Series 2: t2_tirm_tra ipat (a-p) · axial · 3.0mm · 0.74mm/px · 1 of 55 slices shown]
[im 1/55]
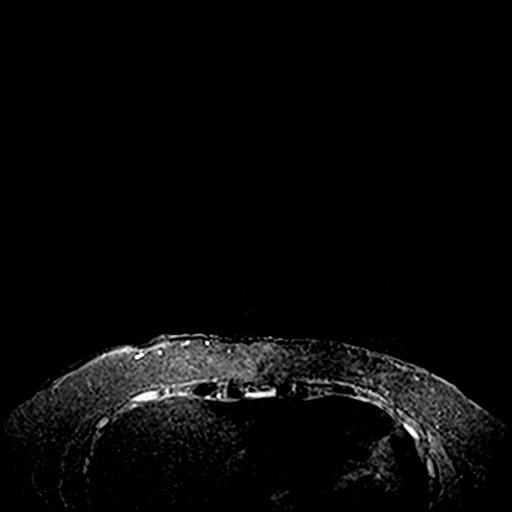

[Series 3: fl3d pre-cm no · axial · non-contrast · 1.2mm · 0.99mm/px · z∈[-86,+86]mm · 5 of 144 slices shown]
[im 1/144]
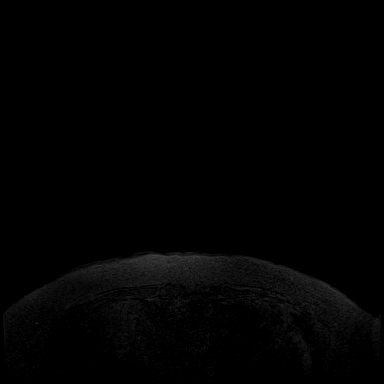
[im 36/144]
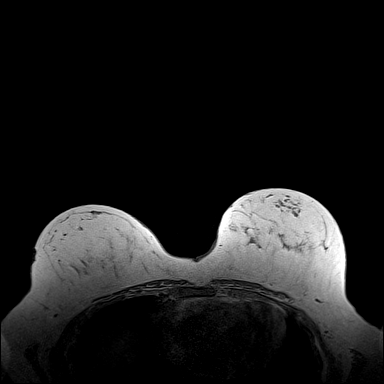
[im 72/144]
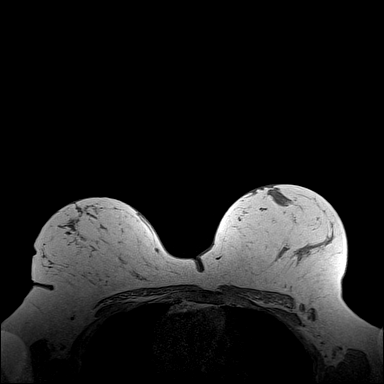
[im 108/144]
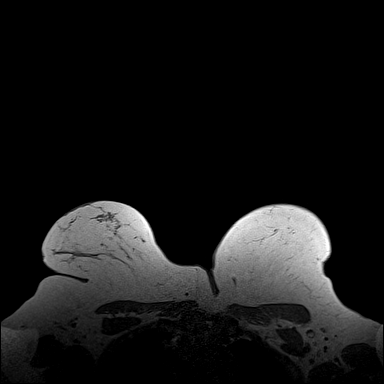
[im 144/144]
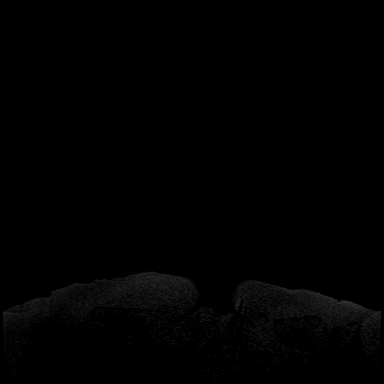

[Series 4: fl3d pre-cm · axial · non-contrast · 1.2mm · 0.99mm/px · z∈[-86,+86]mm · 5 of 144 slices shown]
[im 1/144]
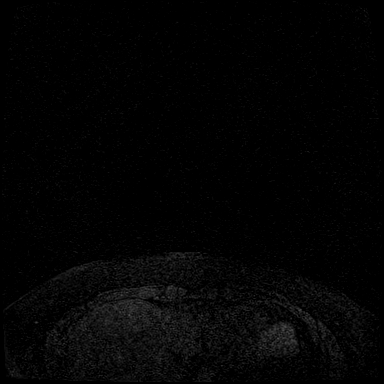
[im 36/144]
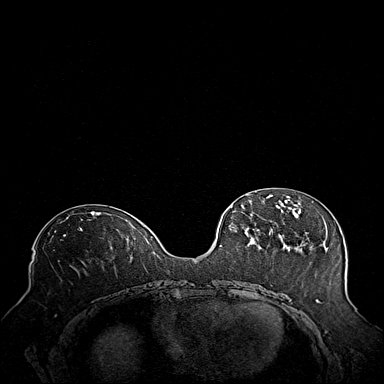
[im 72/144]
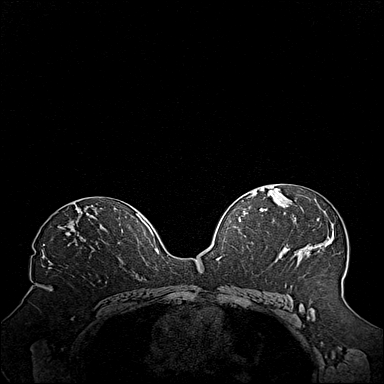
[im 108/144]
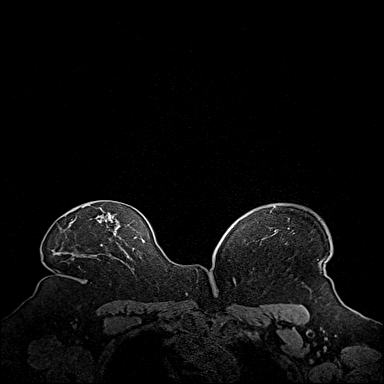
[im 144/144]
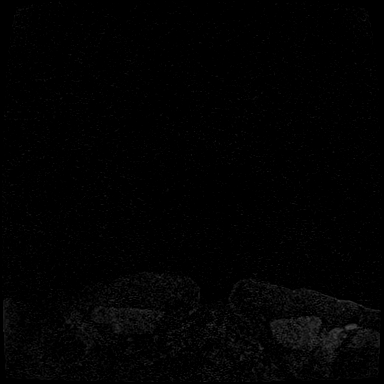

[Series 5: fl3d post immediate · axial · 1.2mm · 0.99mm/px · z∈[-86,+86]mm · 5 of 144 slices shown (1 of 3)]
[im 1/144]
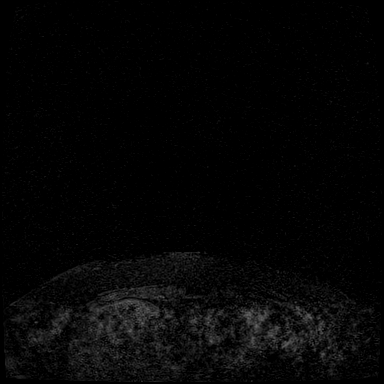
[im 36/144]
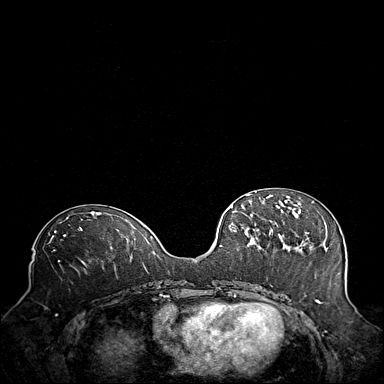
[im 72/144]
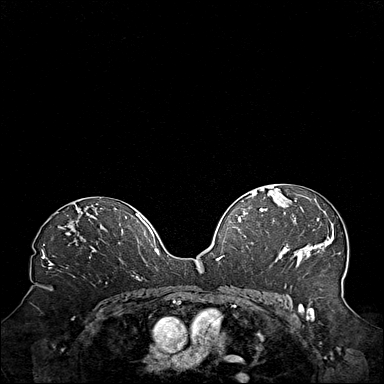
[im 108/144]
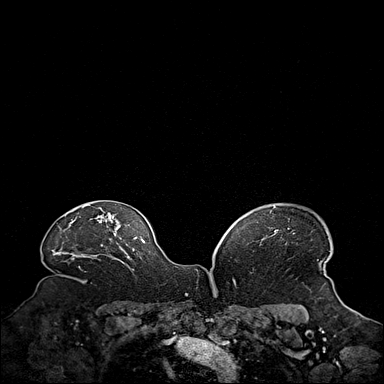
[im 144/144]
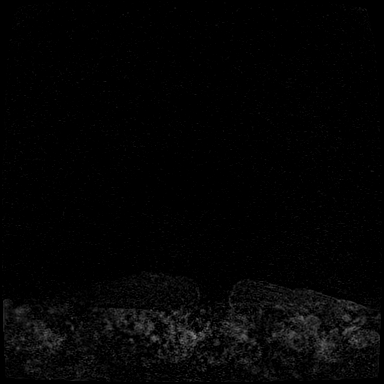

[Series 6: fl3d post immediate · axial · 1.2mm · 0.99mm/px · z∈[-86,+86]mm · 5 of 144 slices shown (2 of 3)]
[im 1/144]
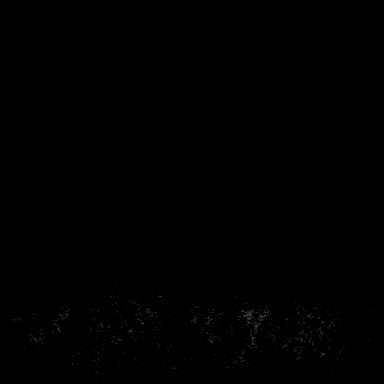
[im 36/144]
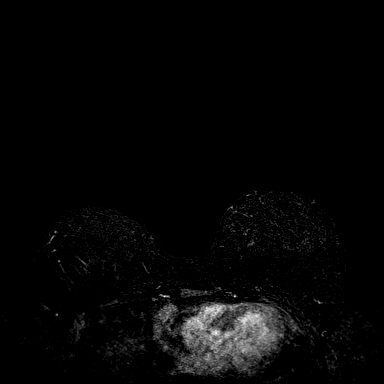
[im 72/144]
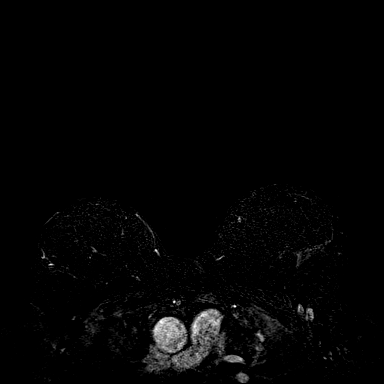
[im 108/144]
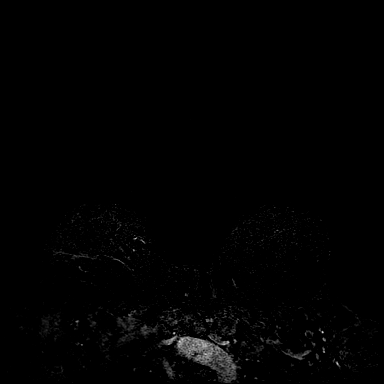
[im 144/144]
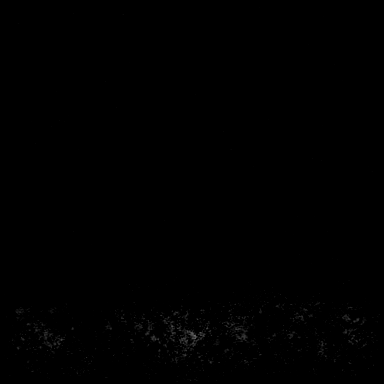

[Series 7: fl3d post immediate · axial · 172.8mm · 0.99mm/px · 1 of 1 slices shown (3 of 3)]
[im 1/1]
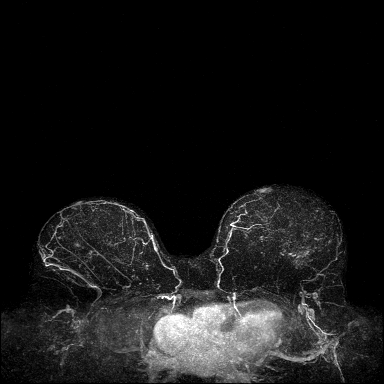

[Series 8: fl3d post 3min · axial · 1.2mm · 0.99mm/px · z∈[-86,+86]mm · 6 of 144 slices shown]
[im 1/144]
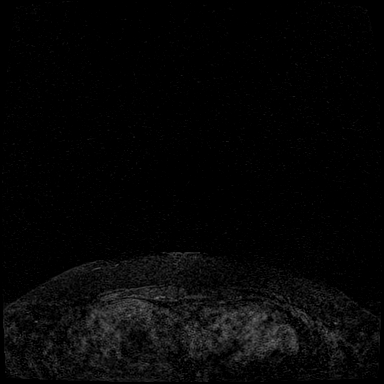
[im 29/144]
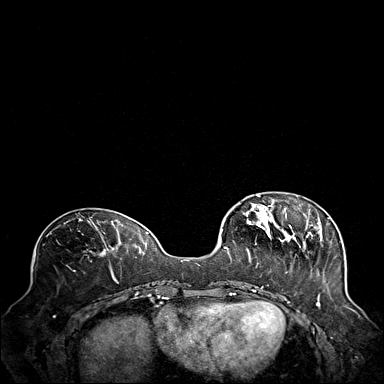
[im 58/144]
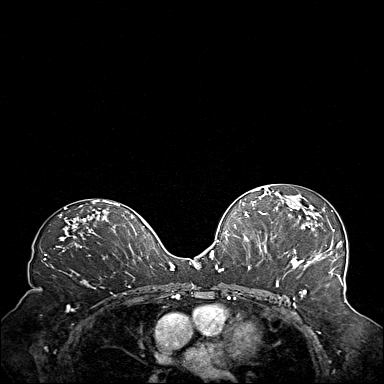
[im 86/144]
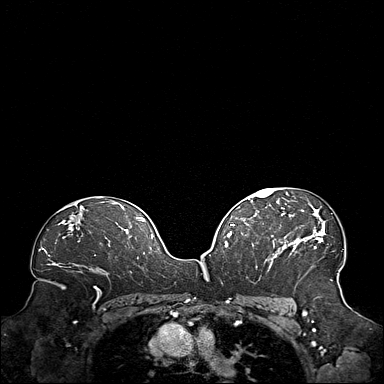
[im 115/144]
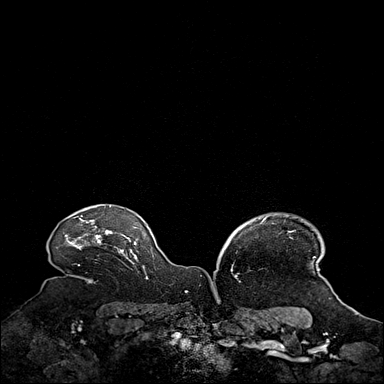
[im 144/144]
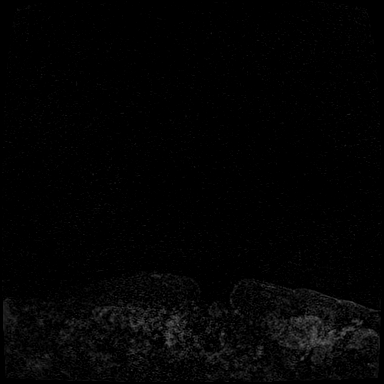

[Series 9: fl3d post 3min_sub · axial · 1.2mm · 0.99mm/px · z∈[-86,+51]mm · 5 of 144 slices shown]
[im 1/144]
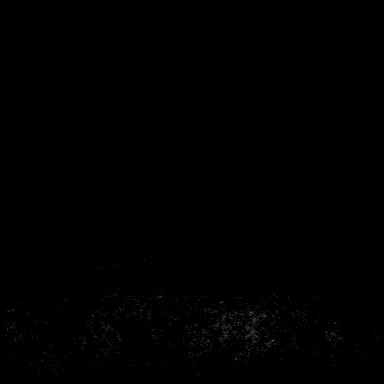
[im 29/144]
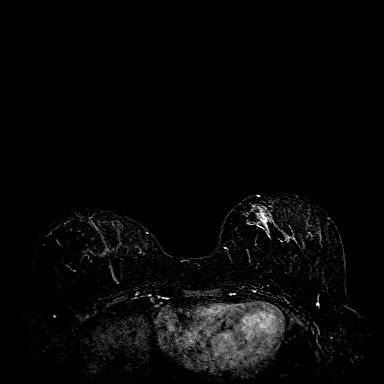
[im 58/144]
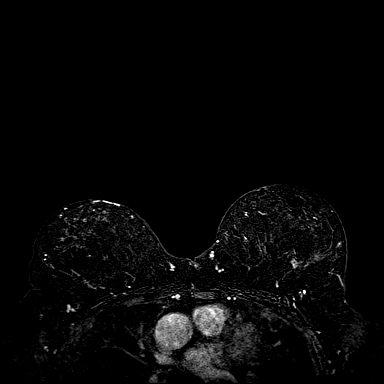
[im 86/144]
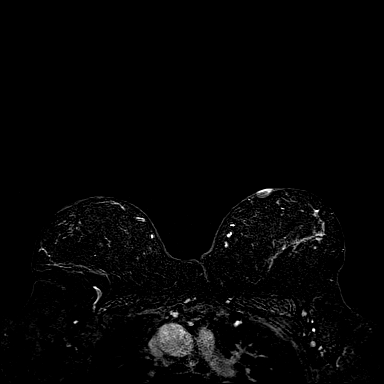
[im 115/144]
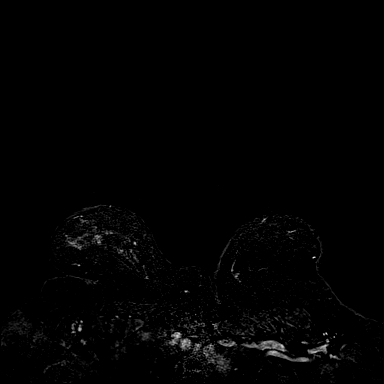

[33 of 48 positions shown; findings below may reference images not displayed]

Three-dimensional MR images were rendered by post-processing of the
original MR data on an independent workstation. The
three-dimensional MR images were interpreted, and findings are
reported in the following complete MRI report for this study. Three
dimensional images were evaluated at the independent interpreting
workstation using the Dyn[employer]CAD thin client.
FINDINGS: Breast composition: b. Scattered fibroglandular tissue.

Background parenchymal enhancement: Moderate

Right breast: Within the inferior right breast 6 o'clock position
there is an 8 mm enhancing mass (image 123; series 6). No additional
concerning areas of enhancement within the right breast.

Left breast: No mass or abnormal enhancement.

Lymph nodes: No abnormal appearing lymph nodes.

Ancillary findings:  None.
IMPRESSION: Indeterminate 8 mm enhancing mass inferior right breast.

RECOMMENDATION:
MRI guided core needle biopsy indeterminate enhancing right breast
mass.

BI-RADS CATEGORY  4: Suspicious.

## 2021-11-19 MED ORDER — GADOBUTROL 1 MMOL/ML IV SOLN
8.0000 mL | Freq: Once | INTRAVENOUS | Status: AC | PRN
  Administered 2021-11-19: 8 mL via INTRAVENOUS

## 2021-11-24 ENCOUNTER — Other Ambulatory Visit: Payer: Self-pay | Admitting: Obstetrics and Gynecology

## 2021-11-24 DIAGNOSIS — R9389 Abnormal findings on diagnostic imaging of other specified body structures: Secondary | ICD-10-CM

## 2021-11-26 ENCOUNTER — Other Ambulatory Visit: Payer: Self-pay | Admitting: Obstetrics and Gynecology

## 2021-11-26 ENCOUNTER — Other Ambulatory Visit: Payer: Self-pay

## 2021-11-26 ENCOUNTER — Ambulatory Visit
Admission: RE | Admit: 2021-11-26 | Discharge: 2021-11-26 | Disposition: A | Discharge status: Home / Self Care | Payer: 59 | Source: Ambulatory Visit | Attending: Obstetrics and Gynecology | Admitting: Obstetrics and Gynecology

## 2021-11-26 ENCOUNTER — Ambulatory Visit: Payer: 59

## 2021-11-26 DIAGNOSIS — R9389 Abnormal findings on diagnostic imaging of other specified body structures: Secondary | ICD-10-CM

## 2021-11-26 MED ORDER — GADOBUTROL 1 MMOL/ML IV SOLN
8.0000 mL | Freq: Once | INTRAVENOUS | Status: AC | PRN
  Administered 2021-11-26: 8 mL via INTRAVENOUS

## 2021-11-29 ENCOUNTER — Other Ambulatory Visit: Payer: Self-pay | Admitting: Obstetrics and Gynecology

## 2021-11-29 DIAGNOSIS — N63 Unspecified lump in unspecified breast: Secondary | ICD-10-CM

## 2021-12-08 ENCOUNTER — Ambulatory Visit
Admission: RE | Admit: 2021-12-08 | Discharge: 2021-12-08 | Disposition: A | Discharge status: Home / Self Care | Payer: 59 | Source: Ambulatory Visit | Attending: Obstetrics and Gynecology | Admitting: Obstetrics and Gynecology

## 2021-12-08 DIAGNOSIS — N63 Unspecified lump in unspecified breast: Secondary | ICD-10-CM

## 2021-12-08 IMAGING — US US BREAST*R* LIMITED INC AXILLA
1 series · 6 of 6 positions shown · non-contrast
Comparison: Previous exams, including the recent Breast MRI
examinations.

CLINICAL DATA: 55-year-old who had a high risk supplemental
screening Breast MRI on [DATE] demonstrating an approximate 7 mm
mass in the far inferior RIGHT breast. At the time attempted MRI
guided biopsy [DATE], the mass was much less conspicuous and is
so far inferior that the biopsy was technically unable to be
performed. She presents for a second-look ultrasound to see if the
mass is sonographically visible.

EXAM:
ULTRASOUND OF THE RIGHT BREAST

[Series 1: us breast*right* limited inc axilla · 0.06mm/px · 6 of 6 slices shown]
[im 1/6]
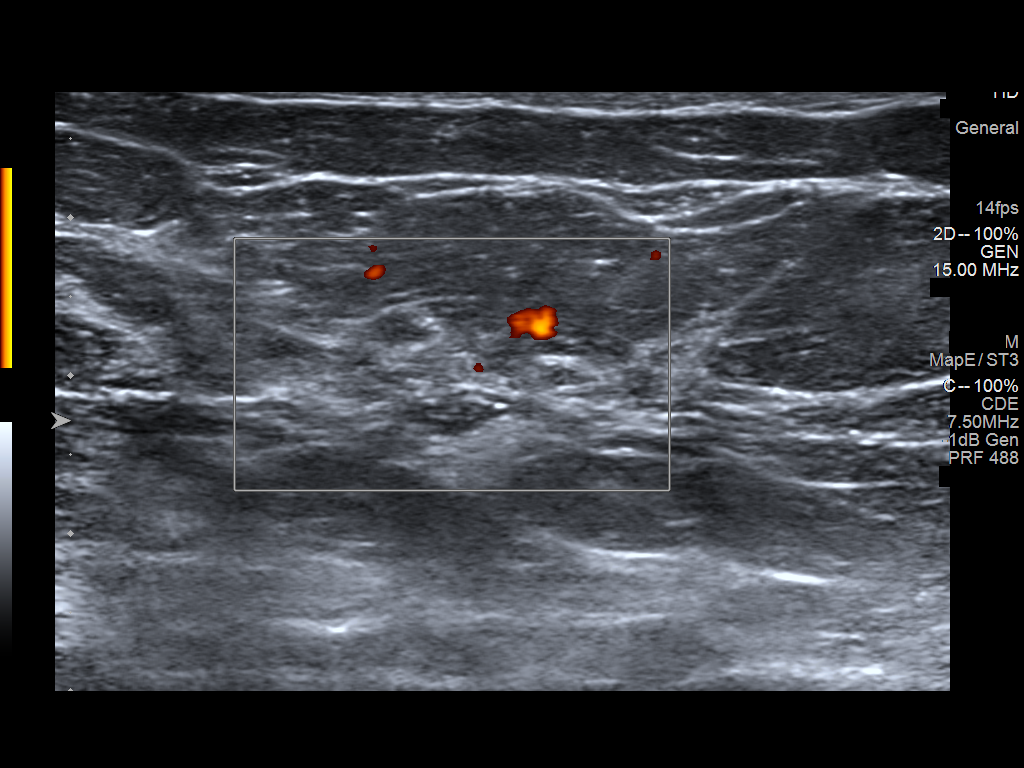
[im 2/6]
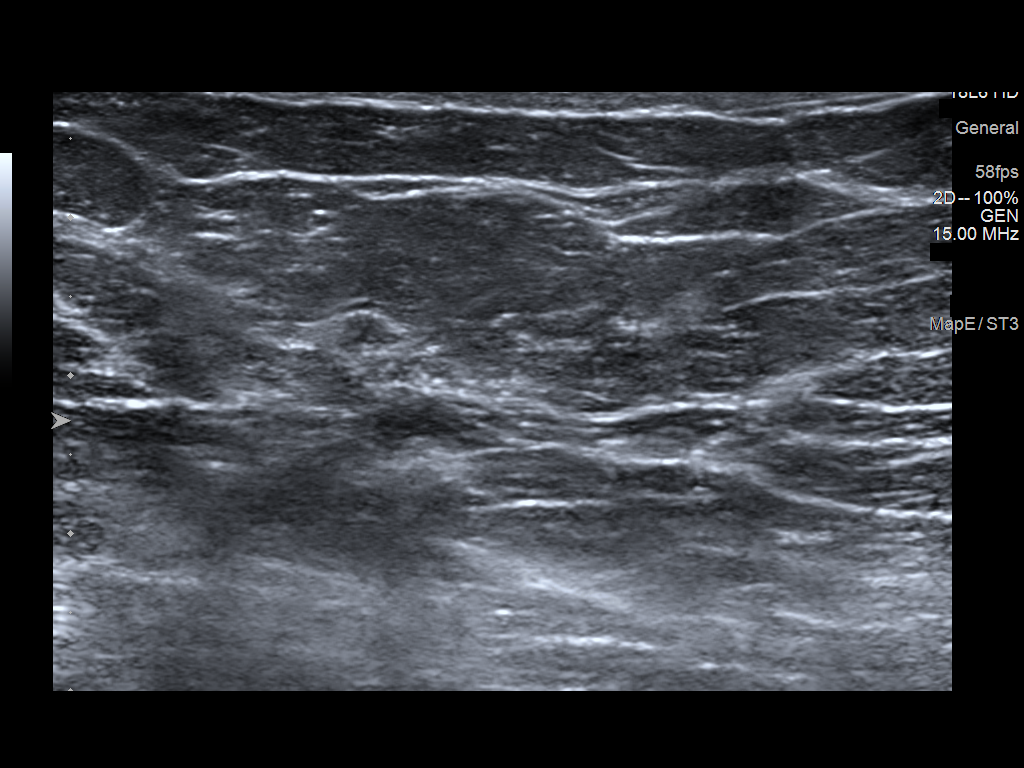
[im 3/6]
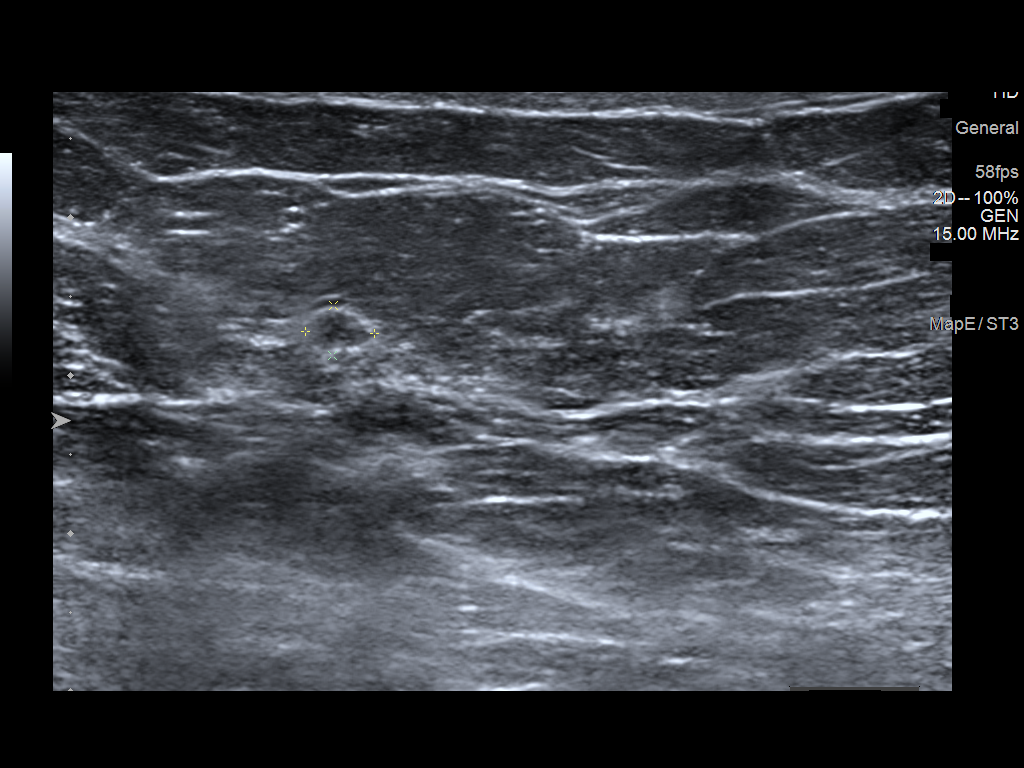
[im 4/6]
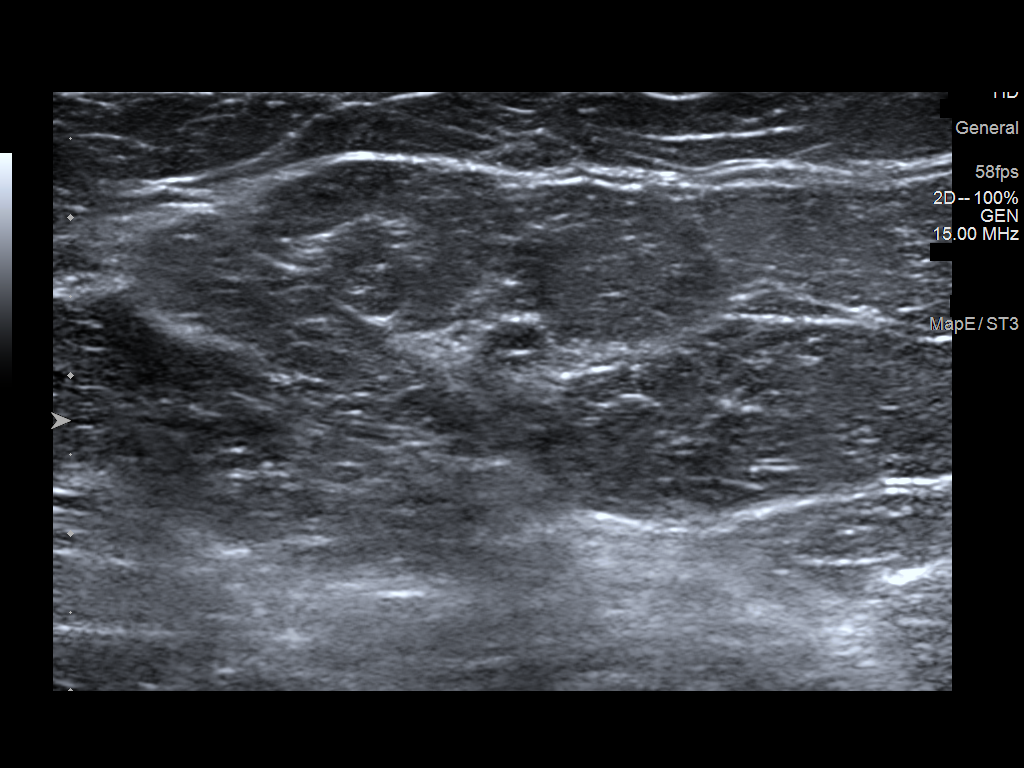
[im 5/6]
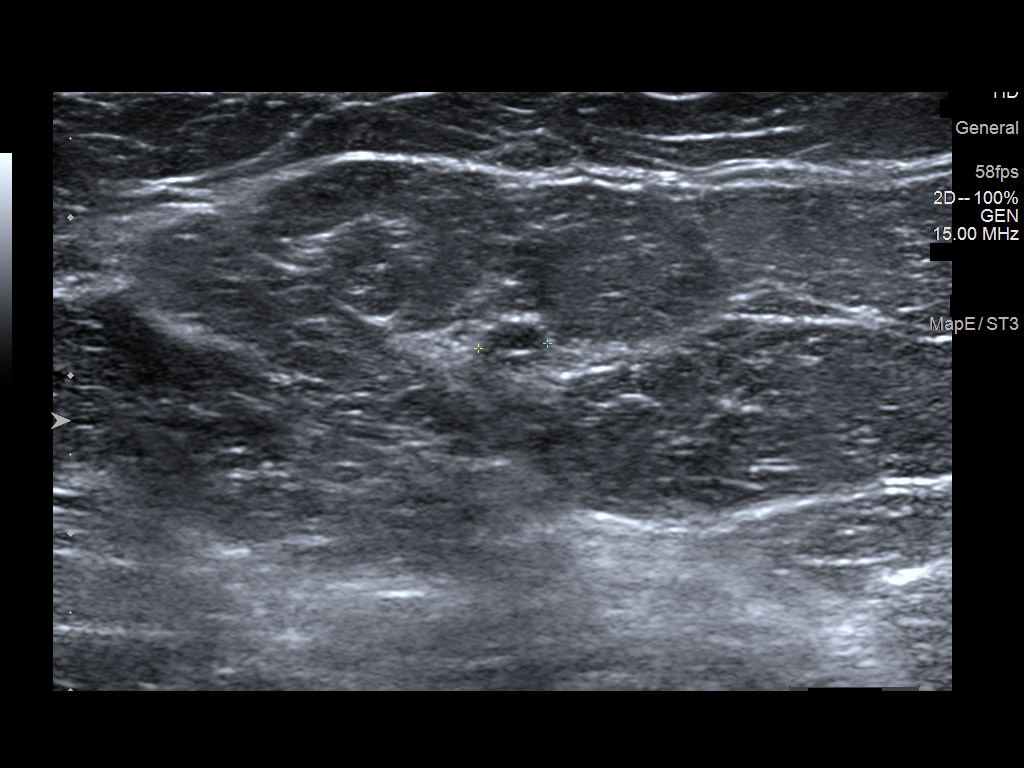
[im 6/6]
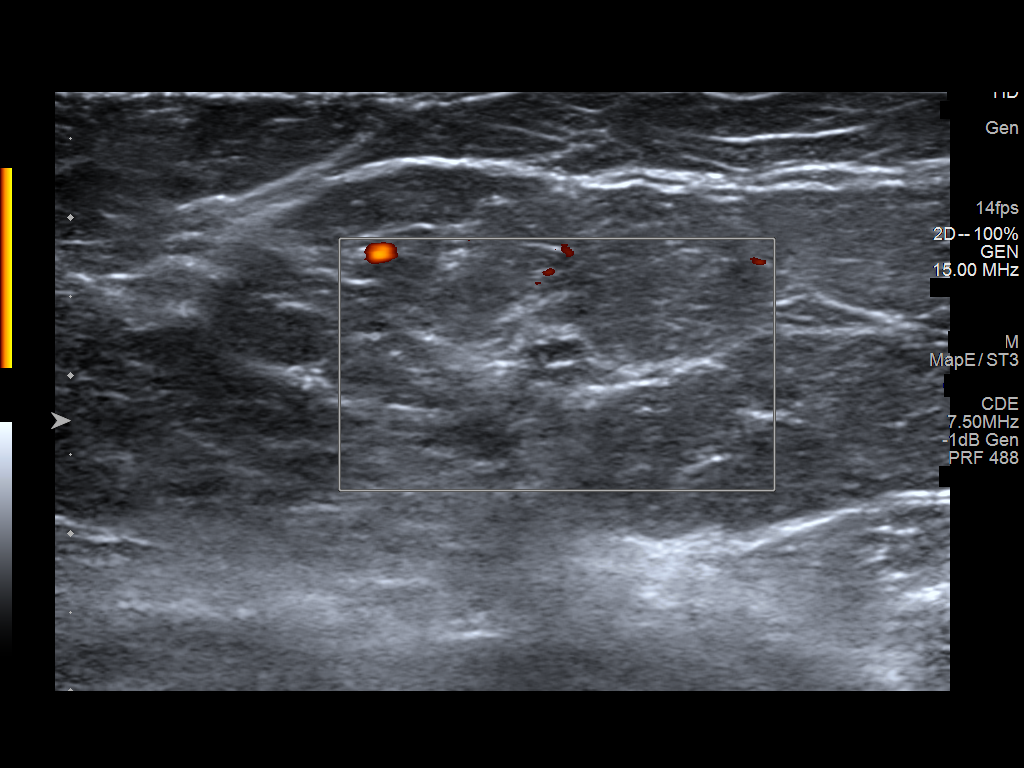

[6 of 6 positions shown; findings below may reference images not displayed]

FINDINGS: I personally performed targeted ultrasound of the lower breast,
showing an anechoic mass with internal echoes the 7 o'clock position
5 cm nipple at posterior depth, measuring approximately blank,
demonstrating no posterior characteristics and no internal power
Doppler flow. This is unlikely to be the sonographic correlate for
the MRI mass. No suspicious solid mass or abnormal acoustic
shadowing is identified.

I reviewed the patient's prior MRI examination from [DATE], and
a vague enhancing focus or mass was present on that examination at
the same location in the far inferior RIGHT breast as the recently
described mass.
IMPRESSION: 1. Benign complicated cyst in the LOWER OUTER QUADRANT of the RIGHT
breast at the 7 o'clock position 5 cm from nipple. This is unlikely
to be the sonographic correlate for the MRI detected mass.
2. No suspicious solid mass or abnormal acoustic shadowing in the
lower breast.

RECOMMENDATION:
1. The patient is due for annual BILATERAL diagnostic mammography in
late [DATE] in order to confirm 2 years of stability of likely
benign RIGHT breast calcifications (located in the upper breast).
2. Follow-up Breast MRI in 6 months to confirm stability of the
enhancing mass in the far inferior RIGHT breast.

I have discussed the findings and recommendations with the patient.
If applicable, a reminder letter will be sent to the patient
regarding the next appointment.

BI-RADS CATEGORY  3: Probably benign.

## 2022-04-12 ENCOUNTER — Other Ambulatory Visit: Payer: Self-pay | Admitting: Obstetrics and Gynecology

## 2022-04-12 DIAGNOSIS — R928 Other abnormal and inconclusive findings on diagnostic imaging of breast: Secondary | ICD-10-CM

## 2022-05-02 ENCOUNTER — Other Ambulatory Visit: Payer: Self-pay | Admitting: Obstetrics and Gynecology

## 2022-05-02 ENCOUNTER — Ambulatory Visit
Admission: RE | Admit: 2022-05-02 | Discharge: 2022-05-02 | Disposition: A | Discharge status: Home / Self Care | Payer: 59 | Source: Ambulatory Visit | Attending: Obstetrics and Gynecology | Admitting: Obstetrics and Gynecology

## 2022-05-02 DIAGNOSIS — R928 Other abnormal and inconclusive findings on diagnostic imaging of breast: Secondary | ICD-10-CM

## 2022-05-02 DIAGNOSIS — R921 Mammographic calcification found on diagnostic imaging of breast: Secondary | ICD-10-CM

## 2022-05-05 ENCOUNTER — Ambulatory Visit
Admission: RE | Admit: 2022-05-05 | Discharge: 2022-05-05 | Disposition: A | Discharge status: Home / Self Care | Payer: 59 | Source: Ambulatory Visit | Attending: Obstetrics and Gynecology | Admitting: Obstetrics and Gynecology

## 2022-05-05 DIAGNOSIS — R921 Mammographic calcification found on diagnostic imaging of breast: Secondary | ICD-10-CM

## 2022-08-09 ENCOUNTER — Other Ambulatory Visit: Payer: Self-pay | Admitting: Obstetrics and Gynecology

## 2022-08-09 DIAGNOSIS — Z129 Encounter for screening for malignant neoplasm, site unspecified: Secondary | ICD-10-CM

## 2022-10-18 ENCOUNTER — Other Ambulatory Visit: Payer: 59
